# Patient Record
Sex: Male | Born: 1970 | Race: White | Hispanic: No | Marital: Single | Smoking: Current every day smoker
Health system: Southern US, Community
[De-identification: ages and names within clinical notes are randomized; demographics above are authoritative.]

## PROBLEM LIST (undated history)

## (undated) DIAGNOSIS — K3532 Acute appendicitis with perforation and localized peritonitis, without abscess: Secondary | ICD-10-CM

## (undated) DIAGNOSIS — K219 Gastro-esophageal reflux disease without esophagitis: Secondary | ICD-10-CM

## (undated) HISTORY — DX: Gastro-esophageal reflux disease without esophagitis: K21.9

## (undated) HISTORY — PX: APPENDECTOMY: SHX54

## (undated) HISTORY — PX: LUMBAR LAMINECTOMY: SHX95

---

## 1998-12-16 ENCOUNTER — Emergency Department (HOSPITAL_COMMUNITY): Admission: EM | Admit: 1998-12-16 | Discharge: 1998-12-16 | Payer: Self-pay | Admitting: Emergency Medicine

## 1998-12-16 ENCOUNTER — Encounter: Payer: Self-pay | Admitting: Emergency Medicine

## 1999-07-05 ENCOUNTER — Emergency Department (HOSPITAL_COMMUNITY): Admission: EM | Admit: 1999-07-05 | Discharge: 1999-07-05 | Payer: Self-pay | Admitting: *Deleted

## 2003-12-03 ENCOUNTER — Emergency Department (HOSPITAL_COMMUNITY): Admission: EM | Admit: 2003-12-03 | Discharge: 2003-12-03 | Payer: Self-pay | Admitting: Emergency Medicine

## 2004-07-04 ENCOUNTER — Emergency Department (HOSPITAL_COMMUNITY): Admission: EM | Admit: 2004-07-04 | Discharge: 2004-07-04 | Payer: Self-pay | Admitting: Emergency Medicine

## 2006-10-05 ENCOUNTER — Emergency Department (HOSPITAL_COMMUNITY): Admission: EM | Admit: 2006-10-05 | Discharge: 2006-10-05 | Payer: Self-pay | Admitting: Emergency Medicine

## 2007-03-06 ENCOUNTER — Emergency Department (HOSPITAL_COMMUNITY): Admission: EM | Admit: 2007-03-06 | Discharge: 2007-03-06 | Payer: Self-pay | Admitting: Emergency Medicine

## 2010-12-10 ENCOUNTER — Inpatient Hospital Stay (INDEPENDENT_AMBULATORY_CARE_PROVIDER_SITE_OTHER)
Admission: RE | Admit: 2010-12-10 | Discharge: 2010-12-10 | Disposition: A | Discharge status: Home / Self Care | Payer: Self-pay | Source: Ambulatory Visit | Attending: Family Medicine | Admitting: Family Medicine

## 2010-12-10 DIAGNOSIS — S93409A Sprain of unspecified ligament of unspecified ankle, initial encounter: Secondary | ICD-10-CM

## 2010-12-10 DIAGNOSIS — L089 Local infection of the skin and subcutaneous tissue, unspecified: Secondary | ICD-10-CM

## 2010-12-10 LAB — GLUCOSE, CAPILLARY: Glucose-Capillary: 96 mg/dL (ref 70–99)

## 2011-01-22 ENCOUNTER — Encounter: Payer: Self-pay | Admitting: Family Medicine

## 2011-01-22 ENCOUNTER — Ambulatory Visit (INDEPENDENT_AMBULATORY_CARE_PROVIDER_SITE_OTHER): Payer: Self-pay | Admitting: Family Medicine

## 2011-01-22 ENCOUNTER — Ambulatory Visit (HOSPITAL_BASED_OUTPATIENT_CLINIC_OR_DEPARTMENT_OTHER)
Admission: RE | Admit: 2011-01-22 | Discharge: 2011-01-22 | Disposition: A | Discharge status: Home / Self Care | Payer: Self-pay | Source: Ambulatory Visit | Attending: Family Medicine | Admitting: Family Medicine

## 2011-01-22 VITALS — BP 138/90 | HR 109 | Temp 97.5°F | Ht 70.0 in | Wt 178.0 lb

## 2011-01-22 DIAGNOSIS — X500XXA Overexertion from strenuous movement or load, initial encounter: Secondary | ICD-10-CM

## 2011-01-22 DIAGNOSIS — M25569 Pain in unspecified knee: Secondary | ICD-10-CM

## 2011-01-22 DIAGNOSIS — M25469 Effusion, unspecified knee: Secondary | ICD-10-CM | POA: Insufficient documentation

## 2011-01-22 DIAGNOSIS — M25562 Pain in left knee: Secondary | ICD-10-CM

## 2011-01-22 MED ORDER — MELOXICAM 15 MG PO TABS: 15.0000 mg | ORAL_TABLET | Freq: Every day | ORAL | Status: DC

## 2011-01-22 MED ORDER — TRAMADOL HCL 50 MG PO TABS: 50.0000 mg | ORAL_TABLET | Freq: Four times a day (QID) | ORAL | Status: AC | PRN

## 2011-01-22 NOTE — Patient Instructions (Signed)
When the [facility] coverage goes through, call me and we'll order an MRI of this knee. You most likely tore meniscus at a minimum and likely tore your ACL as well. Ice your knee 15 minutes at a time 3-4 times a day. Meloxicam 15mg  daily with food for pain and inflammation. Tramadol as needed for severe pain. ACE wrap the area from bottom up to help with swelling. Elevate above the level of your heart when possible also to help with swelling. If you change your mind regarding knee aspiration and injection, call me and we can do this - it should be fairly easy and definitely not as painful as the one you had before.
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
## 2011-01-22 NOTE — Assessment & Plan Note (Signed)
obvious effusion confirmed by ultrasound.  No history of gout and is fairly confident this knee pain/injury started with injury 5-6 weeks ago when inverted left ankle.  Unable to relax enough for adequate ACL/PCL testing.  Medial joint line pain suggests medial meniscal tear.  Degree of effusion would suggest probable ACL tear as well.  Patient declined aspiration of knee today - he had one done 8-10 years ago that was very painful and without local anesthesia.  Advised if he changes his mind to call for an appointment.  Icing, mobic, elevation, ACE wrap, and to call Willie [name] to obtain [facility] coverage if he qualifies then call us.  Would proceed then with MRI to further assess.

## 2012-01-19 ENCOUNTER — Emergency Department (HOSPITAL_COMMUNITY): Payer: Self-pay

## 2012-01-19 ENCOUNTER — Emergency Department (HOSPITAL_COMMUNITY)
Admission: EM | Admit: 2012-01-19 | Discharge: 2012-01-19 | Disposition: A | Discharge status: Home / Self Care | Payer: Self-pay | Attending: Emergency Medicine | Admitting: Emergency Medicine

## 2012-01-19 ENCOUNTER — Encounter (HOSPITAL_COMMUNITY): Payer: Self-pay | Admitting: *Deleted

## 2012-01-19 DIAGNOSIS — S0003XA Contusion of scalp, initial encounter: Secondary | ICD-10-CM | POA: Insufficient documentation

## 2012-01-19 DIAGNOSIS — M79609 Pain in unspecified limb: Secondary | ICD-10-CM | POA: Insufficient documentation

## 2012-01-19 DIAGNOSIS — S0990XA Unspecified injury of head, initial encounter: Secondary | ICD-10-CM

## 2012-01-19 DIAGNOSIS — IMO0002 Reserved for concepts with insufficient information to code with codable children: Secondary | ICD-10-CM | POA: Insufficient documentation

## 2012-01-19 DIAGNOSIS — M542 Cervicalgia: Secondary | ICD-10-CM | POA: Insufficient documentation

## 2012-01-19 DIAGNOSIS — T07XXXA Unspecified multiple injuries, initial encounter: Secondary | ICD-10-CM

## 2012-01-19 DIAGNOSIS — R51 Headache: Secondary | ICD-10-CM | POA: Insufficient documentation

## 2012-01-19 DIAGNOSIS — S0093XA Contusion of unspecified part of head, initial encounter: Secondary | ICD-10-CM

## 2012-01-19 MED ORDER — BACITRACIN ZINC 500 UNIT/GM EX OINT: TOPICAL_OINTMENT | Freq: Two times a day (BID) | CUTANEOUS | Status: DC

## 2012-01-19 MED ORDER — BACITRACIN ZINC 500 UNIT/GM EX OINT
1.0000 "application " | TOPICAL_OINTMENT | Freq: Two times a day (BID) | CUTANEOUS | Status: DC
  Filled 2012-01-19: qty 15

## 2012-01-19 MED ORDER — NAPROXEN 500 MG PO TABS: 500.0000 mg | ORAL_TABLET | Freq: Two times a day (BID) | ORAL | Status: DC

## 2012-01-19 NOTE — ED Notes (Signed)
Pt given ice pack
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
## 2013-02-13 ENCOUNTER — Inpatient Hospital Stay (HOSPITAL_COMMUNITY)
Admission: EM | Admit: 2013-02-13 | Discharge: 2013-02-18 | DRG: 339 | Disposition: A | Discharge status: Home / Self Care | Payer: Medicaid Other | Attending: Surgery | Admitting: Surgery

## 2013-02-13 ENCOUNTER — Emergency Department (HOSPITAL_COMMUNITY): Payer: Medicaid Other

## 2013-02-13 ENCOUNTER — Encounter (HOSPITAL_COMMUNITY): Admission: EM | Disposition: A | Discharge status: Home / Self Care | Payer: Self-pay | Source: Home / Self Care

## 2013-02-13 ENCOUNTER — Encounter (HOSPITAL_COMMUNITY): Payer: Medicaid Other | Admitting: Certified Registered"

## 2013-02-13 ENCOUNTER — Encounter (HOSPITAL_COMMUNITY): Payer: Self-pay | Admitting: Emergency Medicine

## 2013-02-13 ENCOUNTER — Emergency Department (HOSPITAL_COMMUNITY): Payer: Medicaid Other | Admitting: Certified Registered"

## 2013-02-13 DIAGNOSIS — F17200 Nicotine dependence, unspecified, uncomplicated: Secondary | ICD-10-CM | POA: Diagnosis present

## 2013-02-13 DIAGNOSIS — K929 Disease of digestive system, unspecified: Secondary | ICD-10-CM | POA: Diagnosis not present

## 2013-02-13 DIAGNOSIS — Z8249 Family history of ischemic heart disease and other diseases of the circulatory system: Secondary | ICD-10-CM

## 2013-02-13 DIAGNOSIS — Z880 Allergy status to penicillin: Secondary | ICD-10-CM

## 2013-02-13 DIAGNOSIS — Z23 Encounter for immunization: Secondary | ICD-10-CM

## 2013-02-13 DIAGNOSIS — Z833 Family history of diabetes mellitus: Secondary | ICD-10-CM

## 2013-02-13 DIAGNOSIS — T8140XA Infection following a procedure, unspecified, initial encounter: Secondary | ICD-10-CM | POA: Diagnosis not present

## 2013-02-13 DIAGNOSIS — L02219 Cutaneous abscess of trunk, unspecified: Secondary | ICD-10-CM | POA: Diagnosis not present

## 2013-02-13 DIAGNOSIS — K3580 Unspecified acute appendicitis: Secondary | ICD-10-CM

## 2013-02-13 DIAGNOSIS — K352 Acute appendicitis with generalized peritonitis, without abscess: Principal | ICD-10-CM | POA: Diagnosis present

## 2013-02-13 DIAGNOSIS — Y836 Removal of other organ (partial) (total) as the cause of abnormal reaction of the patient, or of later complication, without mention of misadventure at the time of the procedure: Secondary | ICD-10-CM | POA: Diagnosis not present

## 2013-02-13 DIAGNOSIS — Y9210 Unspecified residential institution as the place of occurrence of the external cause: Secondary | ICD-10-CM | POA: Diagnosis not present

## 2013-02-13 DIAGNOSIS — Z79899 Other long term (current) drug therapy: Secondary | ICD-10-CM

## 2013-02-13 DIAGNOSIS — G8918 Other acute postprocedural pain: Secondary | ICD-10-CM | POA: Diagnosis not present

## 2013-02-13 DIAGNOSIS — K3532 Acute appendicitis with perforation and localized peritonitis, without abscess: Secondary | ICD-10-CM | POA: Diagnosis present

## 2013-02-13 DIAGNOSIS — K35209 Acute appendicitis with generalized peritonitis, without abscess, unspecified as to perforation: Principal | ICD-10-CM | POA: Diagnosis present

## 2013-02-13 DIAGNOSIS — K560 Paralytic ileus: Secondary | ICD-10-CM | POA: Diagnosis not present

## 2013-02-13 HISTORY — PX: LAPAROSCOPIC APPENDECTOMY: SHX408

## 2013-02-13 HISTORY — DX: Acute appendicitis with perforation, localized peritonitis, and gangrene, without abscess: K35.32

## 2013-02-13 HISTORY — DX: Acute appendicitis with perforation and localized peritonitis, without abscess: K35.32

## 2013-02-13 LAB — CBC WITH DIFFERENTIAL/PLATELET
Basophils Absolute: 0 10*3/uL (ref 0.0–0.1)
Basophils Relative: 0 % (ref 0–1)
Eosinophils Relative: 0 % (ref 0–5)
HCT: 46.8 % (ref 39.0–52.0)
Hemoglobin: 17 g/dL (ref 13.0–17.0)
MCHC: 36.3 g/dL — ABNORMAL HIGH (ref 30.0–36.0)
MCV: 92.1 fL (ref 78.0–100.0)
Monocytes Absolute: 1.5 10*3/uL — ABNORMAL HIGH (ref 0.1–1.0)
Monocytes Relative: 8 % (ref 3–12)
Neutro Abs: 15.7 10*3/uL — ABNORMAL HIGH (ref 1.7–7.7)
RDW: 12.5 % (ref 11.5–15.5)

## 2013-02-13 LAB — COMPREHENSIVE METABOLIC PANEL
AST: 18 U/L (ref 0–37)
BUN: 13 mg/dL (ref 6–23)
CO2: 23 mEq/L (ref 19–32)
Calcium: 9.1 mg/dL (ref 8.4–10.5)
Chloride: 104 mEq/L (ref 96–112)
Creatinine, Ser: 1 mg/dL (ref 0.50–1.35)
GFR calc non Af Amer: 90 mL/min — ABNORMAL LOW (ref >90)
Total Bilirubin: 0.8 mg/dL (ref 0.3–1.2)

## 2013-02-13 LAB — LIPASE, BLOOD: Lipase: 16 U/L (ref 11–59)

## 2013-02-13 LAB — URINALYSIS, ROUTINE W REFLEX MICROSCOPIC
Leukocytes, UA: NEGATIVE
Nitrite: NEGATIVE
Protein, ur: NEGATIVE mg/dL
Specific Gravity, Urine: <1.005 — ABNORMAL LOW (ref 1.005–1.030)
Urobilinogen, UA: 0.2 mg/dL (ref 0.0–1.0)

## 2013-02-13 SURGERY — APPENDECTOMY, LAPAROSCOPIC
Anesthesia: General | Site: Abdomen | Wound class: Dirty or Infected

## 2013-02-13 MED ORDER — HYDROMORPHONE HCL PF 1 MG/ML IJ SOLN
1.0000 mg | Freq: Once | INTRAMUSCULAR | Status: AC
  Administered 2013-02-13: 1 mg via INTRAVENOUS
  Filled 2013-02-13: qty 1

## 2013-02-13 MED ORDER — ONDANSETRON HCL 4 MG/2ML IJ SOLN
4.0000 mg | Freq: Once | INTRAMUSCULAR | Status: AC
  Administered 2013-02-13: 4 mg via INTRAVENOUS
  Filled 2013-02-13: qty 2

## 2013-02-13 MED ORDER — ONDANSETRON HCL 4 MG/2ML IJ SOLN: 4.0000 mg | Freq: Once | INTRAMUSCULAR | Status: AC

## 2013-02-13 MED ORDER — IOHEXOL 300 MG/ML  SOLN
25.0000 mL | Freq: Once | INTRAMUSCULAR | Status: AC | PRN
  Administered 2013-02-13: 25 mL via ORAL

## 2013-02-13 MED ORDER — SODIUM CHLORIDE 0.9 % IV SOLN
INTRAVENOUS | Status: DC
  Administered 2013-02-13: 20:00:00 via INTRAVENOUS

## 2013-02-13 MED ORDER — SODIUM CHLORIDE 0.9 % IV BOLUS (SEPSIS)
250.0000 mL | Freq: Once | INTRAVENOUS | Status: AC
  Administered 2013-02-13: 250 mL via INTRAVENOUS

## 2013-02-13 MED ORDER — IOHEXOL 300 MG/ML  SOLN
100.0000 mL | Freq: Once | INTRAMUSCULAR | Status: AC | PRN
  Administered 2013-02-13: 100 mL via INTRAVENOUS

## 2013-02-13 MED ORDER — CIPROFLOXACIN IN D5W 400 MG/200ML IV SOLN
400.0000 mg | INTRAVENOUS | Status: AC
  Administered 2013-02-14: 400 mg via INTRAVENOUS
  Filled 2013-02-13: qty 200

## 2013-02-13 MED ORDER — BUPIVACAINE-EPINEPHRINE PF 0.25-1:200000 % IJ SOLN
INTRAMUSCULAR | Status: AC
  Filled 2013-02-13: qty 30

## 2013-02-13 MED ORDER — LACTATED RINGERS IV SOLN
INTRAVENOUS | Status: DC | PRN
  Administered 2013-02-13 – 2013-02-14 (×2): via INTRAVENOUS

## 2013-02-13 SURGICAL SUPPLY — 46 items
APPLIER CLIP ROT 10 11.4 M/L (STAPLE)
BLADE SURG ROTATE 9660 (MISCELLANEOUS) ×2 IMPLANT
CANISTER SUCTION 2500CC (MISCELLANEOUS) ×2 IMPLANT
CHLORAPREP W/TINT 26ML (MISCELLANEOUS) ×2 IMPLANT
CLIP APPLIE ROT 10 11.4 M/L (STAPLE) IMPLANT
COVER SURGICAL LIGHT HANDLE (MISCELLANEOUS) ×2 IMPLANT
CUTTER LINEAR ENDO 35 ETS (STAPLE) ×2 IMPLANT
CUTTER LINEAR ENDO 35 ETS TH (STAPLE) IMPLANT
DECANTER SPIKE VIAL GLASS SM (MISCELLANEOUS) ×2 IMPLANT
DERMABOND ADHESIVE PROPEN (GAUZE/BANDAGES/DRESSINGS) ×1
DERMABOND ADVANCED (GAUZE/BANDAGES/DRESSINGS) ×1
DERMABOND ADVANCED .7 DNX12 (GAUZE/BANDAGES/DRESSINGS) ×1 IMPLANT
DERMABOND ADVANCED .7 DNX6 (GAUZE/BANDAGES/DRESSINGS) ×1 IMPLANT
DRAPE UTILITY 15X26 W/TAPE STR (DRAPE) ×4 IMPLANT
DRSG TEGADERM 4X4.75 (GAUZE/BANDAGES/DRESSINGS) ×2 IMPLANT
ELECT REM PT RETURN 9FT ADLT (ELECTROSURGICAL) ×2
ELECTRODE REM PT RTRN 9FT ADLT (ELECTROSURGICAL) ×1 IMPLANT
ENDOLOOP SUT PDS II  0 18 (SUTURE)
ENDOLOOP SUT PDS II 0 18 (SUTURE) IMPLANT
GLOVE BIOGEL PI IND STRL 7.5 (GLOVE) ×1 IMPLANT
GLOVE BIOGEL PI IND STRL 8 (GLOVE) ×1 IMPLANT
GLOVE BIOGEL PI INDICATOR 7.5 (GLOVE) ×1
GLOVE BIOGEL PI INDICATOR 8 (GLOVE) ×1
GLOVE ECLIPSE 7.5 STRL STRAW (GLOVE) ×2 IMPLANT
GLOVE SURG SS PI 7.5 STRL IVOR (GLOVE) ×4 IMPLANT
GOWN STRL NON-REIN LRG LVL3 (GOWN DISPOSABLE) ×4 IMPLANT
IV NS IRRIG 3000ML ARTHROMATIC (IV SOLUTION) ×2 IMPLANT
KIT BASIN OR (CUSTOM PROCEDURE TRAY) ×2 IMPLANT
KIT ROOM TURNOVER OR (KITS) ×2 IMPLANT
NS IRRIG 1000ML POUR BTL (IV SOLUTION) ×2 IMPLANT
PAD ARMBOARD 7.5X6 YLW CONV (MISCELLANEOUS) ×4 IMPLANT
PENCIL BUTTON HOLSTER BLD 10FT (ELECTRODE) IMPLANT
POUCH SPECIMEN RETRIEVAL 10MM (ENDOMECHANICALS) ×2 IMPLANT
RELOAD /EVU35 (ENDOMECHANICALS) IMPLANT
RELOAD CUTTER ETS 35MM STAND (ENDOMECHANICALS) ×2 IMPLANT
SET IRRIG TUBING LAPAROSCOPIC (IRRIGATION / IRRIGATOR) ×2 IMPLANT
SPECIMEN JAR SMALL (MISCELLANEOUS) ×2 IMPLANT
SUT MNCRL AB 4-0 PS2 18 (SUTURE) ×2 IMPLANT
TOWEL OR 17X24 6PK STRL BLUE (TOWEL DISPOSABLE) ×2 IMPLANT
TOWEL OR 17X26 10 PK STRL BLUE (TOWEL DISPOSABLE) ×2 IMPLANT
TRAY FOLEY CATH 16FR SILVER (SET/KITS/TRAYS/PACK) ×2 IMPLANT
TRAY LAPAROSCOPIC (CUSTOM PROCEDURE TRAY) ×2 IMPLANT
TROCAR XCEL 12X100 BLDLESS (ENDOMECHANICALS) ×2 IMPLANT
TROCAR XCEL BLUNT TIP 100MML (ENDOMECHANICALS) ×2 IMPLANT
TROCAR XCEL NON-BLD 5MMX100MML (ENDOMECHANICALS) ×2 IMPLANT
WATER STERILE IRR 1000ML POUR (IV SOLUTION) IMPLANT

## 2013-02-13 NOTE — ED Notes (Signed)
MD at bedside. 

## 2013-02-13 NOTE — ED Notes (Signed)
Belongings given to pt's wife Karon Heckendorn.

## 2013-02-13 NOTE — ED Notes (Signed)
Pt c/o lower abd pain starting last night, is tender to lower abd with palpation. Denies n/v states he has gotten the chills

## 2013-02-13 NOTE — H&P (Signed)
Willie [name] is an 42 y.o. male.   Chief Complaint: Abdominal pain HPI: 24 hours history of abdominal pain in the RLQ associated with chills and questionable fevers.  No vomiting, no nausea, but poor appetite.  CT done in the ED demonstrates acute appendicitis.  History reviewed. No pertinent past medical history.  History reviewed. No pertinent past surgical history.  Family History  Problem Relation Age of Onset  . Hypertension Mother   . Hyperlipidemia Mother   . Hypertension Father   . Diabetes Father   . Sudden death Neg Hx   . Heart attack Neg Hx    Social History:  reports that he has been smoking Cigarettes.  He has been smoking about 1.00 pack per day. He does not have any smokeless tobacco history on file. He reports that he drinks alcohol. He reports that he does not use illicit drugs.  Allergies:  Allergies  Allergen Reactions  . Penicillins     Unknown reaction     (Not in a hospital admission)  Results for orders placed during the hospital encounter of 02/13/13 (from the past 48 hour(s))  CBC WITH DIFFERENTIAL     Status: Abnormal   Collection Time    02/13/13  6:40 PM      Result Value Range   WBC 19.1 (*) 4.0 - 10.5 K/uL   RBC 5.08  4.22 - 5.81 MIL/uL   Hemoglobin 17.0  13.0 - 17.0 g/dL   HCT 47.8  29.5 - 62.1 %   MCV 92.1  78.0 - 100.0 fL   MCH 33.5  26.0 - 34.0 pg   MCHC 36.3 (*) 30.0 - 36.0 g/dL   RDW 30.8  65.7 - 84.6 %   Platelets 231  150 - 400 K/uL   Neutrophils Relative % 82 (*) 43 - 77 %   Neutro Abs 15.7 (*) 1.7 - 7.7 K/uL   Lymphocytes Relative 9 (*) 12 - 46 %   Lymphs Abs 1.8  0.7 - 4.0 K/uL   Monocytes Relative 8  3 - 12 %   Monocytes Absolute 1.5 (*) 0.1 - 1.0 K/uL   Eosinophils Relative 0  0 - 5 %   Eosinophils Absolute 0.1  0.0 - 0.7 K/uL   Basophils Relative 0  0 - 1 %   Basophils Absolute 0.0  0.0 - 0.1 K/uL  COMPREHENSIVE METABOLIC PANEL     Status: Abnormal   Collection Time    02/13/13  6:40 PM      Result Value Range   Sodium 139  135 - 145 mEq/L   Potassium 3.9  3.5 - 5.1 mEq/L   Chloride 104  96 - 112 mEq/L   CO2 23  19 - 32 mEq/L   Glucose, Bld 111 (*) 70 - 99 mg/dL   BUN 13  6 - 23 mg/dL   Creatinine, Ser 9.62  0.50 - 1.35 mg/dL   Calcium 9.1  8.4 - 95.2 mg/dL   Total Protein 7.1  6.0 - 8.3 g/dL   Albumin 3.8  3.5 - 5.2 g/dL   AST 18  0 - 37 U/L   ALT 13  0 - 53 U/L   Alkaline Phosphatase 81  39 - 117 U/L   Total Bilirubin 0.8  0.3 - 1.2 mg/dL   GFR calc non Af Amer 90 (*) >90 mL/min   GFR calc Af Amer >90  >90 mL/min   Comment: (NOTE)     The eGFR has been calculated using  the CKD EPI equation.     This calculation has not been validated in all clinical situations.     eGFR's persistently <90 mL/min signify possible Chronic Kidney     Disease.  LIPASE, BLOOD     Status: None   Collection Time    02/13/13  6:40 PM      Result Value Range   Lipase 16  11 - 59 U/L  URINALYSIS, ROUTINE W REFLEX MICROSCOPIC     Status: Abnormal   Collection Time    02/13/13 10:30 PM      Result Value Range   Color, Urine YELLOW  YELLOW   APPearance CLEAR  CLEAR   Specific Gravity, Urine <1.005 (*) 1.005 - 1.030   pH 5.5  5.0 - 8.0   Glucose, UA NEGATIVE  NEGATIVE mg/dL   Hgb urine dipstick NEGATIVE  NEGATIVE   Bilirubin Urine NEGATIVE  NEGATIVE   Ketones, ur 15 (*) NEGATIVE mg/dL   Protein, ur NEGATIVE  NEGATIVE mg/dL   Urobilinogen, UA 0.2  0.0 - 1.0 mg/dL   Nitrite NEGATIVE  NEGATIVE   Leukocytes, UA NEGATIVE  NEGATIVE   Comment: MICROSCOPIC NOT DONE ON URINES WITH NEGATIVE PROTEIN, BLOOD, LEUKOCYTES, NITRITE, OR GLUCOSE <1000 mg/dL.   Ct Abdomen Pelvis W Contrast  02/13/2013   CLINICAL DATA:  Lower abdominal pain  EXAM: CT ABDOMEN AND PELVIS WITH CONTRAST  TECHNIQUE: Multidetector CT imaging of the abdomen and pelvis was performed using the standard protocol following bolus administration of intravenous contrast.  CONTRAST:  OMNIPAQUE IOHEXOL 300 MG/ML  SOLN  COMPARISON:  None.  FINDINGS: Lung  bases are free of acute infiltrate or sizable effusion.  The liver, spleen, adrenal glands and pancreas are normal in their CT appearance. The gallbladder is well distended and demonstrates multiple gallstones. No complicating factors are seen. The kidneys are well visualized and reveal a normal enhancement pattern. Normal excretion of contrast is noted on delayed imaging. In the right lower quadrant the appendix is mildly enlarged and periappendiceal inflammatory changes are seen consistent with acute appendicitis. A few small foci of air are noted adjacent to the appendix which may represent micro-perforations although this is difficult to evaluate on this exam. Diffuse diverticular change is noted without evidence of diverticulitis. The bladder is well distended.  IMPRESSION: Changes consistent with appendicitis. A few small foci of air are noted. The possibility of micro-perforation cannot be totally excluded.   Electronically Signed   By: Alcide Clever M.D.   On: 02/13/2013 21:15    Review of Systems  Constitutional: Positive for chills. Negative for fever.  HENT: Negative.   Gastrointestinal: Positive for abdominal pain. Negative for nausea and vomiting.  Musculoskeletal: Negative.   Skin: Negative.   Neurological: Negative.   Endo/Heme/Allergies: Negative.   Psychiatric/Behavioral: Negative.     Blood pressure 103/54, pulse 103, temperature 98.3 F (36.8 C), temperature source Oral, SpO2 93.00%. Physical Exam  Constitutional: He is oriented to person, place, and time. He appears well-developed and well-nourished.  HENT:  Head: Normocephalic and atraumatic.  Eyes: Conjunctivae and EOM are normal. Pupils are equal, round, and reactive to light.  Neck: Normal range of motion. Neck supple.  Cardiovascular: Normal rate, regular rhythm and normal heart sounds.   Respiratory: Effort normal and breath sounds normal. He has no wheezes.  GI: Soft. Bowel sounds are normal. There is tenderness.  There is rebound, guarding and tenderness at McBurney's point.  Musculoskeletal: Normal range of motion.  Neurological: He is alert  and oriented to person, place, and time. He has normal reflexes.  Skin: Skin is warm and dry.  Psychiatric: He has a normal mood and affect. His behavior is normal. Judgment and thought content normal.     Assessment/Plan Acute appendicitis with 42 otherwise healthy smoker CT reviewed and the appendix is in the RLQ extending posterior and laterally. Patient requests a nicotine patch postoperatively.  For Lap Appy. Ciprofloxacin on call to OR.  Cherylynn Ridges 02/13/2013, 11:17 PM

## 2013-02-13 NOTE — ED Notes (Signed)
Pt reports increase in pain. MD made aware.

## 2013-02-13 NOTE — ED Provider Notes (Signed)
CSN: [id number]     Arrival date & time 02/13/13  1823 History   First MD Initiated Contact with Patient 02/13/13 1835     Chief Complaint  Patient presents with  . Abdominal Pain   (Consider location/radiation/quality/duration/timing/severity/associated sxs/prior Treatment) Patient is a 42 y.o. male presenting with abdominal pain. The history is provided by the patient.  Abdominal Pain Associated symptoms: chills   Associated symptoms: no chest pain, no dysuria, no nausea, no shortness of breath and no vomiting    patient well until around the 11:00 PM yesterday when he started with lower quadrant abdominal pain somewhat worse on the right than the left. Not associated with nausea and vomiting this had chills throughout the day and has felt very wiped out. Patient states that the pain is 8/10 constant in nature not made better or worse by anything. Pain is an ache and sharp. Pain is nonradiating.  History reviewed. No pertinent past medical history. History reviewed. No pertinent past surgical history. Family History  Problem Relation Age of Onset  . Hypertension Mother   . Hyperlipidemia Mother   . Hypertension Father   . Diabetes Father   . Sudden death Neg Hx   . Heart attack Neg Hx    History  Substance Use Topics  . Smoking status: Current Every Day Smoker -- 1.00 packs/day    Types: Cigarettes  . Smokeless tobacco: Not on file  . Alcohol Use: Yes    Review of Systems  Constitutional: Positive for chills.  HENT: Negative for congestion.   Eyes: Negative for redness.  Respiratory: Negative for shortness of breath.   Cardiovascular: Negative for chest pain.  Gastrointestinal: Positive for abdominal pain. Negative for nausea and vomiting.  Genitourinary: Negative for dysuria.  Musculoskeletal: Negative for back pain.  Skin: Negative for rash.  Neurological: Negative for headaches.  Hematological: Does not bruise/bleed easily.  Psychiatric/Behavioral: Negative for  confusion.    Allergies  Penicillins  Home Medications   Current Outpatient Rx  Name  Route  Sig  Dispense  Refill  . esomeprazole (NEXIUM) 40 MG capsule   Oral   Take 40 mg by mouth daily as needed (for heartburn).          BP 121/65  Pulse 86  Temp(Src) 98.3 F (36.8 C) (Oral)  SpO2 99% Physical Exam  Nursing note and vitals reviewed. Constitutional: He is oriented to person, place, and time. He appears well-developed and well-nourished. No distress.  HENT:  Head: Normocephalic and atraumatic.  Mouth/Throat: Oropharynx is clear and moist.  Eyes: Conjunctivae and EOM are normal. Pupils are equal, round, and reactive to light.  Neck: Normal range of motion.  Cardiovascular: Normal rate and regular rhythm.   No murmur heard. Pulmonary/Chest: Effort normal and breath sounds normal.  Abdominal: Soft. Bowel sounds are normal. He exhibits no distension. There is tenderness.  Musculoskeletal: Normal range of motion. He exhibits no tenderness.  Neurological: He is alert and oriented to person, place, and time. No cranial nerve deficit. He exhibits normal muscle tone. Coordination normal.  Skin: Skin is warm. No rash noted.    ED Course  Procedures (including critical care time) Labs Review Labs Reviewed  CBC WITH DIFFERENTIAL - Abnormal; Notable for the following:    WBC 19.1 (*)    MCHC 36.3 (*)    Neutrophils Relative % 82 (*)    Neutro Abs 15.7 (*)    Lymphocytes Relative 9 (*)    Monocytes Absolute 1.5 (*)  All other components within normal limits  COMPREHENSIVE METABOLIC PANEL - Abnormal; Notable for the following:    Glucose, Bld 111 (*)    GFR calc non Af Amer 90 (*)    All other components within normal limits  LIPASE, BLOOD  URINALYSIS, ROUTINE W REFLEX MICROSCOPIC   Results for orders placed during the hospital encounter of 02/13/13  CBC WITH DIFFERENTIAL      Result Value Range   WBC 19.1 (*) 4.0 - 10.5 K/uL   RBC 5.08  4.22 - 5.81 MIL/uL    Hemoglobin 17.0  13.0 - 17.0 g/dL   HCT 16.1  09.6 - 04.5 %   MCV 92.1  78.0 - 100.0 fL   MCH 33.5  26.0 - 34.0 pg   MCHC 36.3 (*) 30.0 - 36.0 g/dL   RDW 40.9  81.1 - 91.4 %   Platelets 231  150 - 400 K/uL   Neutrophils Relative % 82 (*) 43 - 77 %   Neutro Abs 15.7 (*) 1.7 - 7.7 K/uL   Lymphocytes Relative 9 (*) 12 - 46 %   Lymphs Abs 1.8  0.7 - 4.0 K/uL   Monocytes Relative 8  3 - 12 %   Monocytes Absolute 1.5 (*) 0.1 - 1.0 K/uL   Eosinophils Relative 0  0 - 5 %   Eosinophils Absolute 0.1  0.0 - 0.7 K/uL   Basophils Relative 0  0 - 1 %   Basophils Absolute 0.0  0.0 - 0.1 K/uL  COMPREHENSIVE METABOLIC PANEL      Result Value Range   Sodium 139  135 - 145 mEq/L   Potassium 3.9  3.5 - 5.1 mEq/L   Chloride 104  96 - 112 mEq/L   CO2 23  19 - 32 mEq/L   Glucose, Bld 111 (*) 70 - 99 mg/dL   BUN 13  6 - 23 mg/dL   Creatinine, Ser 7.82  0.50 - 1.35 mg/dL   Calcium 9.1  8.4 - 95.6 mg/dL   Total Protein 7.1  6.0 - 8.3 g/dL   Albumin 3.8  3.5 - 5.2 g/dL   AST 18  0 - 37 U/L   ALT 13  0 - 53 U/L   Alkaline Phosphatase 81  39 - 117 U/L   Total Bilirubin 0.8  0.3 - 1.2 mg/dL   GFR calc non Af Amer 90 (*) >90 mL/min   GFR calc Af Amer >90  >90 mL/min  LIPASE, BLOOD      Result Value Range   Lipase 16  11 - 59 U/L    Imaging Review Ct Abdomen Pelvis W Contrast  02/13/2013   CLINICAL DATA:  Lower abdominal pain  EXAM: CT ABDOMEN AND PELVIS WITH CONTRAST  TECHNIQUE: Multidetector CT imaging of the abdomen and pelvis was performed using the standard protocol following bolus administration of intravenous contrast.  CONTRAST:  OMNIPAQUE IOHEXOL 300 MG/ML  SOLN  COMPARISON:  None.  FINDINGS: Lung bases are free of acute infiltrate or sizable effusion.  The liver, spleen, adrenal glands and pancreas are normal in their CT appearance. The gallbladder is well distended and demonstrates multiple gallstones. No complicating factors are seen. The kidneys are well visualized and reveal a normal  enhancement pattern. Normal excretion of contrast is noted on delayed imaging. In the right lower quadrant the appendix is mildly enlarged and periappendiceal inflammatory changes are seen consistent with acute appendicitis. A few small foci of air are noted adjacent to the appendix which  may represent micro-perforations although this is difficult to evaluate on this exam. Diffuse diverticular change is noted without evidence of diverticulitis. The bladder is well distended.  IMPRESSION: Changes consistent with appendicitis. A few small foci of air are noted. The possibility of micro-perforation cannot be totally excluded.   Electronically Signed   By: Alcide Clever M.D.   On: 02/13/2013 21:15    EKG Interpretation   None       MDM   1. Acute appendicitis    CT scan confirms appendicitis. Patient's onset of symptoms would've been 11:00 PM on Thursday. We'll discuss with Gen. surgery.    Shelda Jakes, MD 02/13/13 2211

## 2013-02-13 NOTE — ED Notes (Signed)
Pt reports lower abdominal pain since last night, progressively worsening. Pt denies nausea, BM today and normal. Pain increased with palpation. Pt appears to be in discomfort. Placed on monitor.

## 2013-02-13 NOTE — Anesthesia Preprocedure Evaluation (Signed)
Anesthesia Evaluation  Patient identified by MRN, date of birth, ID band Patient awake    Reviewed: Allergy & Precautions, H&P , NPO status , Patient's Chart, lab work & pertinent test results  Airway Mallampati: II TM Distance: >3 FB Neck ROM: Full    Dental no notable dental hx. (+) Teeth Intact and Dental Advisory Given   Pulmonary Current Smoker,  breath sounds clear to auscultation  Pulmonary exam normal       Cardiovascular negative cardio ROS  Rhythm:Regular Rate:Normal     Neuro/Psych negative neurological ROS  negative psych ROS   GI/Hepatic negative GI ROS, Neg liver ROS,   Endo/Other  negative endocrine ROS  Renal/GU negative Renal ROS  negative genitourinary   Musculoskeletal   Abdominal   Peds  Hematology negative hematology ROS (+)   Anesthesia Other Findings   Reproductive/Obstetrics negative OB ROS                         Anesthesia Physical Anesthesia Plan  ASA: II and emergent  Anesthesia Plan: General   Post-op Pain Management:    Induction: Intravenous, Rapid sequence and Cricoid pressure planned  Airway Management Planned: Oral ETT  Additional Equipment:   Intra-op Plan:   Post-operative Plan: Extubation in OR  Informed Consent: I have reviewed the patients History and Physical, chart, labs and discussed the procedure including the risks, benefits and alternatives for the proposed anesthesia with the patient or authorized representative who has indicated his/her understanding and acceptance.   Dental advisory given  Plan Discussed with: CRNA  Anesthesia Plan Comments:         Anesthesia Quick Evaluation  

## 2013-02-13 NOTE — ED Notes (Signed)
[phone] Willie [name]

## 2013-02-13 NOTE — ED Notes (Signed)
Pt transported to OR

## 2013-02-14 ENCOUNTER — Encounter (HOSPITAL_COMMUNITY): Payer: Self-pay | Admitting: Surgery

## 2013-02-14 LAB — CBC
HCT: 44.2 % (ref 39.0–52.0)
MCH: 33.5 pg (ref 26.0–34.0)
MCHC: 35.1 g/dL (ref 30.0–36.0)
MCV: 95.7 fL (ref 78.0–100.0)
Platelets: 218 10*3/uL (ref 150–400)
RDW: 12.7 % (ref 11.5–15.5)
WBC: 20.7 10*3/uL — ABNORMAL HIGH (ref 4.0–10.5)

## 2013-02-14 LAB — BASIC METABOLIC PANEL
BUN: 11 mg/dL (ref 6–23)
CO2: 25 mEq/L (ref 19–32)
Calcium: 8.9 mg/dL (ref 8.4–10.5)
Chloride: 108 mEq/L (ref 96–112)
Creatinine, Ser: 1.01 mg/dL (ref 0.50–1.35)
GFR calc Af Amer: >90 mL/min (ref >90)
GFR calc non Af Amer: 89 mL/min — ABNORMAL LOW (ref >90)
Glucose, Bld: 154 mg/dL — ABNORMAL HIGH (ref 70–99)

## 2013-02-14 MED ORDER — NICOTINE 21 MG/24HR TD PT24
21.0000 mg | MEDICATED_PATCH | Freq: Every day | TRANSDERMAL | Status: DC
  Administered 2013-02-14 – 2013-02-18 (×6): 21 mg via TRANSDERMAL
  Filled 2013-02-14 (×7): qty 1

## 2013-02-14 MED ORDER — CIPROFLOXACIN IN D5W 400 MG/200ML IV SOLN
400.0000 mg | Freq: Two times a day (BID) | INTRAVENOUS | Status: DC
  Administered 2013-02-14 – 2013-02-18 (×8): 400 mg via INTRAVENOUS
  Filled 2013-02-14 (×10): qty 200

## 2013-02-14 MED ORDER — SODIUM CHLORIDE 0.9 % IR SOLN
Status: DC | PRN
  Administered 2013-02-14: 1000 mL

## 2013-02-14 MED ORDER — METRONIDAZOLE IN NACL 5-0.79 MG/ML-% IV SOLN
500.0000 mg | Freq: Once | INTRAVENOUS | Status: AC
  Administered 2013-02-14: 500 mg via INTRAVENOUS
  Filled 2013-02-14: qty 100

## 2013-02-14 MED ORDER — INFLUENZA VAC SPLIT QUAD 0.5 ML IM SUSP
0.5000 mL | INTRAMUSCULAR | Status: AC
  Administered 2013-02-15: 0.5 mL via INTRAMUSCULAR
  Filled 2013-02-14: qty 0.5

## 2013-02-14 MED ORDER — METRONIDAZOLE IN NACL 5-0.79 MG/ML-% IV SOLN
500.0000 mg | Freq: Three times a day (TID) | INTRAVENOUS | Status: DC
  Administered 2013-02-14 – 2013-02-18 (×13): 500 mg via INTRAVENOUS
  Filled 2013-02-14 (×15): qty 100

## 2013-02-14 MED ORDER — BUPIVACAINE-EPINEPHRINE 0.25% -1:200000 IJ SOLN
INTRAMUSCULAR | Status: DC | PRN
  Administered 2013-02-14: 19 mL

## 2013-02-14 MED ORDER — HYDROMORPHONE HCL PF 1 MG/ML IJ SOLN
1.0000 mg | INTRAMUSCULAR | Status: DC | PRN
Start: 2013-02-14 — End: 2013-02-18
  Administered 2013-02-14 (×4): 1 mg via INTRAVENOUS
  Administered 2013-02-15: 2 mg via INTRAVENOUS
  Administered 2013-02-15: 1 mg via INTRAVENOUS
  Administered 2013-02-15: 2 mg via INTRAVENOUS
  Administered 2013-02-15: 1 mg via INTRAVENOUS
  Administered 2013-02-15 – 2013-02-16 (×2): 2 mg via INTRAVENOUS
  Administered 2013-02-16: 1 mg via INTRAVENOUS
  Filled 2013-02-14 (×2): qty 1
  Filled 2013-02-14 (×2): qty 2
  Filled 2013-02-14 (×5): qty 1
  Filled 2013-02-14 (×2): qty 2

## 2013-02-14 MED ORDER — KCL IN DEXTROSE-NACL 20-5-0.45 MEQ/L-%-% IV SOLN
INTRAVENOUS | Status: DC
  Administered 2013-02-14: 17:00:00 via INTRAVENOUS
  Administered 2013-02-14 – 2013-02-15 (×2): 125 mL/h via INTRAVENOUS
  Administered 2013-02-15 (×2): via INTRAVENOUS
  Filled 2013-02-14 (×7): qty 1000

## 2013-02-14 MED ORDER — ROCURONIUM BROMIDE 100 MG/10ML IV SOLN
INTRAVENOUS | Status: DC | PRN
  Administered 2013-02-14: 25 mg via INTRAVENOUS

## 2013-02-14 MED ORDER — PANTOPRAZOLE SODIUM 40 MG IV SOLR
40.0000 mg | INTRAVENOUS | Status: DC
  Administered 2013-02-14 – 2013-02-17 (×4): 40 mg via INTRAVENOUS
  Filled 2013-02-14 (×6): qty 40

## 2013-02-14 MED ORDER — ONDANSETRON HCL 4 MG/2ML IJ SOLN
INTRAMUSCULAR | Status: DC | PRN
  Administered 2013-02-14: 4 mg via INTRAVENOUS

## 2013-02-14 MED ORDER — HYDROMORPHONE HCL PF 1 MG/ML IJ SOLN
INTRAMUSCULAR | Status: AC
  Filled 2013-02-14: qty 1

## 2013-02-14 MED ORDER — OXYCODONE HCL 5 MG PO TABS: 5.0000 mg | ORAL_TABLET | Freq: Once | ORAL | Status: DC | PRN

## 2013-02-14 MED ORDER — LIDOCAINE HCL (CARDIAC) 20 MG/ML IV SOLN
INTRAVENOUS | Status: DC | PRN
  Administered 2013-02-14: 100 mg via INTRAVENOUS

## 2013-02-14 MED ORDER — OXYCODONE HCL 5 MG/5ML PO SOLN: 5.0000 mg | Freq: Once | ORAL | Status: DC | PRN

## 2013-02-14 MED ORDER — DEXAMETHASONE SODIUM PHOSPHATE 4 MG/ML IJ SOLN
INTRAMUSCULAR | Status: DC | PRN
  Administered 2013-02-14: 4 mg via INTRAVENOUS

## 2013-02-14 MED ORDER — OXYCODONE-ACETAMINOPHEN 5-325 MG PO TABS
1.0000 | ORAL_TABLET | ORAL | Status: DC | PRN
  Administered 2013-02-14 (×3): 2 via ORAL
  Administered 2013-02-14: 1 via ORAL
  Administered 2013-02-15 – 2013-02-18 (×10): 2 via ORAL
  Filled 2013-02-14 (×14): qty 2

## 2013-02-14 MED ORDER — ONDANSETRON HCL 4 MG PO TABS: 4.0000 mg | ORAL_TABLET | Freq: Four times a day (QID) | ORAL | Status: DC | PRN

## 2013-02-14 MED ORDER — MIDAZOLAM HCL 5 MG/5ML IJ SOLN
INTRAMUSCULAR | Status: DC | PRN
  Administered 2013-02-13: 2 mg via INTRAVENOUS

## 2013-02-14 MED ORDER — SUCCINYLCHOLINE CHLORIDE 20 MG/ML IJ SOLN
INTRAMUSCULAR | Status: DC | PRN
  Administered 2013-02-14: 100 mg via INTRAVENOUS

## 2013-02-14 MED ORDER — NEOSTIGMINE METHYLSULFATE 1 MG/ML IJ SOLN
INTRAMUSCULAR | Status: DC | PRN
  Administered 2013-02-14: 3 mg via INTRAVENOUS

## 2013-02-14 MED ORDER — GLYCOPYRROLATE 0.2 MG/ML IJ SOLN
INTRAMUSCULAR | Status: DC | PRN
  Administered 2013-02-14: 0.4 mg via INTRAVENOUS

## 2013-02-14 MED ORDER — HYDROMORPHONE HCL PF 1 MG/ML IJ SOLN
0.2500 mg | INTRAMUSCULAR | Status: DC | PRN
  Administered 2013-02-14 (×2): 0.5 mg via INTRAVENOUS

## 2013-02-14 MED ORDER — ONDANSETRON HCL 4 MG/2ML IJ SOLN
4.0000 mg | Freq: Four times a day (QID) | INTRAMUSCULAR | Status: DC | PRN
  Administered 2013-02-15 – 2013-02-16 (×2): 4 mg via INTRAVENOUS
  Filled 2013-02-14 (×2): qty 2

## 2013-02-14 MED ORDER — PROPOFOL 10 MG/ML IV BOLUS
INTRAVENOUS | Status: DC | PRN
  Administered 2013-02-14: 130 mg via INTRAVENOUS

## 2013-02-14 MED ORDER — SUFENTANIL CITRATE 50 MCG/ML IV SOLN
INTRAVENOUS | Status: DC | PRN
  Administered 2013-02-14: 20 ug via INTRAVENOUS

## 2013-02-14 NOTE — Anesthesia Postprocedure Evaluation (Signed)
  Anesthesia Post-op Note  Patient: Willie [name]  Procedure(s) Performed: Procedure(s): APPENDECTOMY LAPAROSCOPIC (N/A)  Patient Location: PACU  Anesthesia Type:General  Level of Consciousness: awake and alert   Airway and Oxygen Therapy: Patient Spontanous Breathing  Post-op Pain: moderate  Post-op Assessment: Post-op Vital signs reviewed, Patient's Cardiovascular Status Stable and Respiratory Function Stable  Post-op Vital Signs: Reviewed  Filed Vitals:   02/14/13 0500  BP: 112/63  Pulse: 77  Temp: 36.6 C  Resp: 16    Complications: No apparent anesthesia complications

## 2013-02-14 NOTE — Transfer of Care (Signed)
Immediate Anesthesia Transfer of Care Note  Patient: Willie [name]  Procedure(s) Performed: Procedure(s): APPENDECTOMY LAPAROSCOPIC (N/A)  Patient Location: PACU  Anesthesia Type:General  Level of Consciousness: oriented, sedated, patient cooperative and responds to stimulation  Airway & Oxygen Therapy: Patient Spontanous Breathing and Patient connected to nasal cannula oxygen  Post-op Assessment: Report given to PACU RN, Post -op Vital signs reviewed and stable and Patient moving all extremities X 4  Post vital signs: Reviewed and stable  Complications: No apparent anesthesia complications

## 2013-02-14 NOTE — Op Note (Addendum)
OPERATIVE REPORT  DATE OF OPERATION:  02/14/2013  PATIENT:  Willie [name]  42 y.[name]. male  PRE-OPERATIVE DIAGNOSIS:  Acute appendicitis  POST-OPERATIVE DIAGNOSIS:  Acute appendicitis with rupture and peritonitis  PROCEDURE:  Procedure(s): APPENDECTOMY LAPAROSCOPIC  SURGEON:  Surgeon(s): Cherylynn Ridges, MD  ASSISTANT: None  ANESTHESIA:   general  EBL: <50 ml  BLOOD ADMINISTERED: none  DRAINS: none   SPECIMEN:  Source of Specimen:  Rutured appendix  COUNTS CORRECT:  YES  PROCEDURE DETAILS: The patient was taken to the operating room and placed on the table in the supine position. After an adequate general endotracheal anesthetic was administered, he was prepped and draped in usual sterile manner exposing his entire abdomen.  After proper time out was performed identifying the patient and the procedure to be performed, a supraumbilical midline incision was made using a #15 blade. This was taken down to the midline fascia. The fascia was incised with a #15 blade. We subsequently bluntly dissected down into the peritoneal cavity. A pursestring suture of 0 Vicryl was passed around the fascial opening. A Hassan cannula was passed into the peritoneal cavity and secured in place with the pursestring suture.  Right upper quadrant 5 mm cannula and the left lower quadrant 12 mm cannula placed under direct vision with the patient in Trendelenburg position. Immediately upon inserting the laparoscope purulent fluid was noted in the right lower quadrant. The patient was febrile prior to surgery. Pictures were taken of the acutely inflamed and perforated appendix with purulent drainage and peritonitis in the right lower quadrant.  We dissected out the appendix at the base of the cecum. We came across the base using an Endo GIA with a 3.5 mm blue cartridge. We subsequently used a wide cartridge 2.5 mm Endo GIA across the mesoappendix. There was some bleeding subsequently the had to be controlled with  electrocautery.    We irrigated with approximately 2-1/2 L of saline solution. The appendix was retrieved through the left lower quadrant cannula site using an Endo Catch bag. The patient was given intraoperative metronidazole because of the perforation.  Once we irrigated with copious amounts of we removed the supraumbilical cannula and tied off the fascia using a Pershing suture that was in place. The other cannulas were removed after all fluid and gas was aspirated from the peritoneal cavity.  Quarter percent Marcaine was injected at all sites. The left lower quadrant in the supraumbilical skin sites were closed using a running subcuticular stitch of 4-0 Monocryl. Dermabond Steri-Strips and Tegaderms use complete all dressings. All needle counts, sponge counts, and instrument counts were correct.  PATIENT DISPOSITION:  PACU - hemodynamically stable.   Willie [name] 11/1/20141:21 AM

## 2013-02-14 NOTE — Progress Notes (Signed)
1 Day Post-Op   Assessment: s/p Procedure(s): APPENDECTOMY LAPAROSCOPIC Patient Active Problem List   Diagnosis Date Noted  . Acute fulminating appendicitis with perforation and peritonitis 02/13/2013  . Left knee pain 01/22/2011    Improved from pre-op  Plan: No change today, continue antibiotics due to pre op infection, will not advance diet yet to be sure does not have ileus from the perforated appendix;encouraged ambulation Will recheck cbc in am   Subjective: Feels better than pre-op, no nausea and taking clears OK  Objective: Vital signs in last 24 hours: Temp:  [97.9 F (36.6 C)-98.9 F (37.2 C)] 97.9 F (36.6 C) (11/01 0500) Pulse Rate:  [77-103] 77 (11/01 0500) Resp:  [12-17] 16 (11/01 0500) BP: (103-121)/(54-70) 112/63 mmHg (11/01 0500) SpO2:  [93 %-99 %] 98 % (11/01 0500) Weight:  [170 lb (77.111 kg)] 170 lb (77.111 kg) (10/31 2354)   Intake/Output from previous day: 10/31 0701 - 11/01 0700 In: 1241.7 [I.V.:1241.7] Out: 1851 [Urine:1850; Stool:1]  General appearance: alert, cooperative and no distress Resp: clear to auscultation bilaterally GI: Soft and not distended or tender.  Incision: no significant drainage  Lab Results:   Recent Labs  02/13/13 1840 02/14/13 0500  WBC 19.1* 20.7*  HGB 17.0 15.5  HCT 46.8 44.2  PLT 231 218   BMET  Recent Labs  02/13/13 1840 02/14/13 0500  NA 139 141  K 3.9 4.1  CL 104 108  CO2 23 25  GLUCOSE 111* 154*  BUN 13 11  CREATININE 1.00 1.01  CALCIUM 9.1 8.9    MEDS, Scheduled . ciprofloxacin  400 mg Intravenous Q12H  . HYDROmorphone      . metronidazole  500 mg Intravenous Q8H  . nicotine  21 mg Transdermal Daily  . pantoprazole (PROTONIX) IV  40 mg Intravenous Q24H    Studies/Results: Ct Abdomen Pelvis W Contrast  02/13/2013   CLINICAL DATA:  Lower abdominal pain  EXAM: CT ABDOMEN AND PELVIS WITH CONTRAST  TECHNIQUE: Multidetector CT imaging of the abdomen and pelvis was performed using the  standard protocol following bolus administration of intravenous contrast.  CONTRAST:  OMNIPAQUE IOHEXOL 300 MG/ML  SOLN  COMPARISON:  None.  FINDINGS: Lung bases are free of acute infiltrate or sizable effusion.  The liver, spleen, adrenal glands and pancreas are normal in their CT appearance. The gallbladder is well distended and demonstrates multiple gallstones. No complicating factors are seen. The kidneys are well visualized and reveal a normal enhancement pattern. Normal excretion of contrast is noted on delayed imaging. In the right lower quadrant the appendix is mildly enlarged and periappendiceal inflammatory changes are seen consistent with acute appendicitis. A few small foci of air are noted adjacent to the appendix which may represent micro-perforations although this is difficult to evaluate on this exam. Diffuse diverticular change is noted without evidence of diverticulitis. The bladder is well distended.  IMPRESSION: Changes consistent with appendicitis. A few small foci of air are noted. The possibility of micro-perforation cannot be totally excluded.   Electronically Signed   By: Alcide Clever M.D.   On: 02/13/2013 21:15      LOS: 1 day     Currie Paris, MD, [facility], [place] [phone]   02/14/2013 10:24 AM

## 2013-02-15 LAB — URINALYSIS, ROUTINE W REFLEX MICROSCOPIC
Bilirubin Urine: NEGATIVE
Glucose, UA: NEGATIVE mg/dL
Hgb urine dipstick: NEGATIVE
Ketones, ur: NEGATIVE mg/dL
Protein, ur: NEGATIVE mg/dL
Urobilinogen, UA: 0.2 mg/dL (ref 0.0–1.0)

## 2013-02-15 LAB — CBC
HCT: 43 % (ref 39.0–52.0)
Hemoglobin: 14.9 g/dL (ref 13.0–17.0)
MCH: 32.7 pg (ref 26.0–34.0)
MCHC: 34.7 g/dL (ref 30.0–36.0)
RDW: 12.6 % (ref 11.5–15.5)
WBC: 16.3 10*3/uL — ABNORMAL HIGH (ref 4.0–10.5)

## 2013-02-15 MED ORDER — PNEUMOCOCCAL VAC POLYVALENT 25 MCG/0.5ML IJ INJ
0.5000 mL | INJECTION | INTRAMUSCULAR | Status: AC
  Administered 2013-02-16: 0.5 mL via INTRAMUSCULAR
  Filled 2013-02-15: qty 0.5

## 2013-02-15 NOTE — Progress Notes (Signed)
2 Days Post-Op   Assessment: s/p Procedure(s): APPENDECTOMY LAPAROSCOPIC Patient Active Problem List   Diagnosis Date Noted  . Acute fulminating appendicitis with perforation and peritonitis 02/13/2013  . Left knee pain 01/22/2011    Stable post op, seems to be improving except for some increase in abd pain today  Plan: Advance diet Will check U/A  Subjective: No nausea, voiding but burning on urination, passing gas, wants more to eat, more pain today than yesterday.  Objective: Vital signs in last 24 hours: Temp:  [96.8 F (36 C)-98.1 F (36.7 C)] 98 F (36.7 C) (11/02 0528) Pulse Rate:  [60-72] 68 (11/02 0702) Resp:  [18] 18 (11/02 0528) BP: (89-115)/(24-75) 106/61 mmHg (11/02 0702) SpO2:  [95 %-99 %] 97 % (11/02 0528)   Intake/Output from previous day: 11/01 0701 - 11/02 0700 In: 3160 [P.O.:360; I.V.:2500; IV Piggyback:300] Out: 900 [Urine:900]  General appearance: alert, cooperative and no distress Resp: clear to auscultation bilaterally GI: Soft, mild diffuse tender, no rebound  Incision: healing well, no significant drainage  Lab Results:   Recent Labs  02/14/13 0500 02/15/13 0547  WBC 20.7* 16.3*  HGB 15.5 14.9  HCT 44.2 43.0  PLT 218 208   BMET  Recent Labs  02/13/13 1840 02/14/13 0500  NA 139 141  K 3.9 4.1  CL 104 108  CO2 23 25  GLUCOSE 111* 154*  BUN 13 11  CREATININE 1.00 1.01  CALCIUM 9.1 8.9    MEDS, Scheduled . ciprofloxacin  400 mg Intravenous Q12H  . influenza vac split quadrivalent PF  0.5 mL Intramuscular Tomorrow-1000  . metronidazole  500 mg Intravenous Q8H  . nicotine  21 mg Transdermal Daily  . pantoprazole (PROTONIX) IV  40 mg Intravenous Q24H    Studies/Results: Ct Abdomen Pelvis W Contrast  02/13/2013   CLINICAL DATA:  Lower abdominal pain  EXAM: CT ABDOMEN AND PELVIS WITH CONTRAST  TECHNIQUE: Multidetector CT imaging of the abdomen and pelvis was performed using the standard protocol following bolus  administration of intravenous contrast.  CONTRAST:  OMNIPAQUE IOHEXOL 300 MG/ML  SOLN  COMPARISON:  None.  FINDINGS: Lung bases are free of acute infiltrate or sizable effusion.  The liver, spleen, adrenal glands and pancreas are normal in their CT appearance. The gallbladder is well distended and demonstrates multiple gallstones. No complicating factors are seen. The kidneys are well visualized and reveal a normal enhancement pattern. Normal excretion of contrast is noted on delayed imaging. In the right lower quadrant the appendix is mildly enlarged and periappendiceal inflammatory changes are seen consistent with acute appendicitis. A few small foci of air are noted adjacent to the appendix which may represent micro-perforations although this is difficult to evaluate on this exam. Diffuse diverticular change is noted without evidence of diverticulitis. The bladder is well distended.  IMPRESSION: Changes consistent with appendicitis. A few small foci of air are noted. The possibility of micro-perforation cannot be totally excluded.   Electronically Signed   By: Alcide Clever M.D.   On: 02/13/2013 21:15      LOS: 2 days     Currie Paris, MD, [facility], [place] [phone]   02/15/2013 8:42 AM

## 2013-02-16 LAB — CBC
HCT: 43.1 % (ref 39.0–52.0)
Hemoglobin: 15.1 g/dL (ref 13.0–17.0)
MCH: 33 pg (ref 26.0–34.0)
MCHC: 35 g/dL (ref 30.0–36.0)
RBC: 4.57 MIL/uL (ref 4.22–5.81)
RDW: 12.5 % (ref 11.5–15.5)

## 2013-02-16 MED ORDER — OXYMETAZOLINE HCL 0.05 % NA SOLN
1.0000 | Freq: Two times a day (BID) | NASAL | Status: DC | PRN
  Filled 2013-02-16: qty 15

## 2013-02-16 MED ORDER — VANCOMYCIN HCL IN DEXTROSE 1-5 GM/200ML-% IV SOLN
1000.0000 mg | Freq: Two times a day (BID) | INTRAVENOUS | Status: DC
  Administered 2013-02-16 – 2013-02-18 (×5): 1000 mg via INTRAVENOUS
  Filled 2013-02-16 (×6): qty 200

## 2013-02-16 MED ORDER — POLYETHYLENE GLYCOL 3350 17 G PO PACK
17.0000 g | PACK | Freq: Every day | ORAL | Status: DC
  Administered 2013-02-16 – 2013-02-17 (×2): 17 g via ORAL
  Filled 2013-02-16 (×3): qty 1

## 2013-02-16 MED ORDER — SODIUM CHLORIDE 0.9 % IJ SOLN: 3.0000 mL | INTRAMUSCULAR | Status: DC | PRN

## 2013-02-16 MED ORDER — ENOXAPARIN SODIUM 40 MG/0.4ML ~~LOC~~ SOLN
40.0000 mg | SUBCUTANEOUS | Status: DC
  Administered 2013-02-16 – 2013-02-18 (×3): 40 mg via SUBCUTANEOUS
  Filled 2013-02-16 (×4): qty 0.4

## 2013-02-16 MED ORDER — SODIUM CHLORIDE 0.9 % IJ SOLN
3.0000 mL | Freq: Two times a day (BID) | INTRAMUSCULAR | Status: DC
  Administered 2013-02-16 – 2013-02-17 (×2): 3 mL via INTRAVENOUS

## 2013-02-16 MED ORDER — SODIUM CHLORIDE 0.9 % IV SOLN
250.0000 mL | INTRAVENOUS | Status: DC | PRN
  Administered 2013-02-16: 250 mL via INTRAVENOUS

## 2013-02-16 MED ORDER — KETOROLAC TROMETHAMINE 15 MG/ML IJ SOLN
15.0000 mg | Freq: Four times a day (QID) | INTRAMUSCULAR | Status: DC | PRN
  Administered 2013-02-16: 30 mg via INTRAVENOUS
  Administered 2013-02-16: 15 mg via INTRAVENOUS
  Administered 2013-02-17: 30 mg via INTRAVENOUS
  Administered 2013-02-17 (×2): 15 mg via INTRAVENOUS
  Filled 2013-02-16: qty 2
  Filled 2013-02-16 (×2): qty 1
  Filled 2013-02-16: qty 2
  Filled 2013-02-16: qty 1

## 2013-02-16 NOTE — Progress Notes (Signed)
ANTIBIOTIC CONSULT NOTE - INITIAL  Pharmacy Consult for vancomycin Indication: cellulitis  Allergies  Allergen Reactions  . Penicillins     Unknown reaction    Patient Measurements: Height: 5\' 10"  (177.8 cm) Weight: 170 lb (77.111 kg) IBW/kg (Calculated) : 73   Vital Signs: Temp: 98.2 F (36.8 C) (11/03 1000) Temp src: Oral (11/03 1000) BP: 109/62 mmHg (11/03 1000) Pulse Rate: 86 (11/03 1000) Intake/Output from previous day: 11/02 0701 - 11/03 0700 In: 1456 [P.O.:840; I.V.:616] Out: 300 [Urine:300] Intake/Output from this shift: Total I/O In: -  Out: 400 [Urine:400]  Labs:  Recent Labs  02/13/13 1840 02/14/13 0500 02/15/13 0547 02/16/13 0515  WBC 19.1* 20.7* 16.3* 11.9*  HGB 17.0 15.5 14.9 15.1  PLT 231 218 208 207  CREATININE 1.00 1.01  --   --    Estimated Creatinine Clearance: 96.4 ml/min (by C-G formula based on Cr of 1.01). No results found for this basename: VANCOTROUGH, VANCOPEAK, VANCORANDOM, GENTTROUGH, GENTPEAK, GENTRANDOM, TOBRATROUGH, TOBRAPEAK, TOBRARND, AMIKACINPEAK, AMIKACINTROU, AMIKACIN,  in the last 72 hours   Microbiology: No results found for this or any previous visit (from the past 720 hour(s)).  Medical History: Past Medical History  Diagnosis Date  . Acute fulminating appendicitis with perforation and peritonitis 02/13/2013    Lap appy on [id number]   Assessment: 42 YOM s/p lap appy for appendicitis with perf. On Flagyl and cipro for empiric coverage of appendicitis. Now with erythema around surgical site concerning for cellulitis and to start vancomycin. SCr 1 with CrCl ~105mL/min. WBC 11.9, Tmax/24h 100.  Goal of Therapy:  Vancomycin trough level 10-15 mcg/ml  Plan:  1. Vancomycin 1000mg  IV q12h 2. Follow up renal function, clinical progression, C/S, trough at SS, LOT  Chantrice Hagg D. Jorell Agne, PharmD Clinical Pharmacist Pager: [phone] 02/16/2013 12:18 PM

## 2013-02-16 NOTE — Progress Notes (Signed)
3 Days Post-Op  Subjective: Pain better, +flatus, no bm yet.  Tolerating full liquids.  Ambulating, using IS.  Objective: Vital signs in last 24 hours: Temp:  [97.6 F (36.4 C)-100 F (37.8 C)] 100 F (37.8 C) (11/03 0521) Pulse Rate:  [89-101] 101 (11/03 0521) Resp:  [18-19] 19 (11/03 0521) BP: (96-124)/(61-72) 111/72 mmHg (11/03 0521) SpO2:  [95 %-99 %] 95 % (11/03 0521) Last BM Date: 02/13/13  Intake/Output from previous day: 11/02 0701 - 11/03 0700 In: 1456 [P.O.:840; I.V.:616] Out: 300 [Urine:300] Intake/Output this shift:    General appearance: alert, cooperative and no distress GI: +bs abdomen is mildy distended, soft.  TTP left side with erythema surrounding the incision site and extending to the flank, no fluctuance or induration to suggest an abscess.  umbilical and RUQ site without erythema or drainage.  no evidence of peritonitis.   Lab Results:   Recent Labs  02/15/13 0547 02/16/13 0515  WBC 16.3* 11.9*  HGB 14.9 15.1  HCT 43.0 43.1  PLT 208 207   BMET  Recent Labs  02/13/13 1840 02/14/13 0500  NA 139 141  K 3.9 4.1  CL 104 108  CO2 23 25  GLUCOSE 111* 154*  BUN 13 11  CREATININE 1.00 1.01  CALCIUM 9.1 8.9    Anti-infectives: Anti-infectives   Start     Dose/Rate Route Frequency Ordered Stop   02/14/13 0245  metroNIDAZOLE (FLAGYL) IVPB 500 mg     500 mg 100 mL/hr over 60 Minutes Intravenous Every 8 hours 02/14/13 0238     02/14/13 0245  ciprofloxacin (CIPRO) IVPB 400 mg     400 mg 200 mL/hr over 60 Minutes Intravenous Every 12 hours 02/14/13 0238     02/14/13 0045  metroNIDAZOLE (FLAGYL) IVPB 500 mg     500 mg 100 mL/hr over 60 Minutes Intravenous  Once 02/14/13 0030 02/14/13 0042   02/13/13 2315  [MAR Hold]  ciprofloxacin (CIPRO) IVPB 400 mg     (On MAR Hold since 02/14/13 0005)   400 mg 200 mL/hr over 60 Minutes Intravenous On call to O.R. 02/13/13 2257 02/14/13 0015      Assessment/Plan: Acute appendicitis with perforation S/p  Lap Appy 02/14/13 Dr. Lindie Spruce  I am concerned with erythema on the left which is new from yesterday.  There no evident abscess.  He is on cipro/flagyl.  Will monitor. -advance diet -add toradol -ambulate, SCD, add lovenox -continue with atbx -add miralax    LOS: 3 days    Willie [name] ANP-BC 02/16/2013 8:18 AM

## 2013-02-16 NOTE — Progress Notes (Signed)
I have seen and examined the patient and agree with the assessment and plans. Postop ileus.  Will also watch wound.  Natania Finigan A. Magnus Ivan  MD, FACS

## 2013-02-17 ENCOUNTER — Encounter (HOSPITAL_COMMUNITY): Payer: Self-pay | Admitting: General Surgery

## 2013-02-17 LAB — CBC
HCT: 40.6 % (ref 39.0–52.0)
Hemoglobin: 14.5 g/dL (ref 13.0–17.0)
MCV: 93.5 fL (ref 78.0–100.0)
RBC: 4.34 MIL/uL (ref 4.22–5.81)
RDW: 12.3 % (ref 11.5–15.5)
WBC: 12.7 10*3/uL — ABNORMAL HIGH (ref 4.0–10.5)

## 2013-02-17 MED ORDER — PANTOPRAZOLE SODIUM 40 MG PO TBEC
40.0000 mg | DELAYED_RELEASE_TABLET | Freq: Every day | ORAL | Status: DC
  Administered 2013-02-17: 40 mg via ORAL
  Filled 2013-02-17: qty 1

## 2013-02-17 NOTE — Progress Notes (Signed)
I have seen and examined the patient and agree with the assessment and plans.  Konstantina Nachreiner A. Adelei Scobey  MD, FACS  

## 2013-02-17 NOTE — Progress Notes (Signed)
4 Days Post-Op  Subjective: Had nausea yesterday after miralax. Denies vomiting.  +flatus, no bm.  Pain is a little better today.  Denies chills or sweats.    Objective: Vital signs in last 24 hours: Temp:  [97.7 F (36.5 C)-98.7 F (37.1 C)] 97.7 F (36.5 C) (11/04 0865) Pulse Rate:  [79-86] 80 (11/04 0613) Resp:  [18] 18 (11/04 0613) BP: (96-116)/(52-69) 107/60 mmHg (11/04 0613) SpO2:  [95 %-98 %] 97 % (11/04 0613) Last BM Date: 02/13/13  Intake/Output from previous day: 11/03 0701 - 11/04 0700 In: 2800 [P.O.:240; I.V.:60; IV Piggyback:2500] Out: 700 [Urine:700] Intake/Output this shift:   Physical Exam: General appearance: alert, cooperative and no distress  GI: +bs abdomen is mildy distended, soft. TTP left side with erythema surrounding the incision site and extending to the flank, no fluctuance or induration to suggest an abscess, flank erythema has extended, abdominal aspect erythema has decreased. umbilical and RUQ site without erythema or drainage. no evidence of peritonitis.    Lab Results:   Recent Labs  02/16/13 0515 02/17/13 0707  WBC 11.9* 12.7*  HGB 15.1 14.5  HCT 43.1 40.6  PLT 207 221   BMET No results found for this basename: NA, K, CL, CO2, GLUCOSE, BUN, CREATININE, CALCIUM,  in the last 72 hours PT/INR No results found for this basename: LABPROT, INR,  in the last 72 hours ABG No results found for this basename: PHART, PCO2, PO2, HCO3,  in the last 72 hours  Studies/Results: No results found.  Anti-infectives: Anti-infectives   Start     Dose/Rate Route Frequency Ordered Stop   02/16/13 1300  vancomycin (VANCOCIN) IVPB 1000 mg/200 mL premix     1,000 mg 200 mL/hr over 60 Minutes Intravenous Every 12 hours 02/16/13 1218     02/14/13 0245  metroNIDAZOLE (FLAGYL) IVPB 500 mg     500 mg 100 mL/hr over 60 Minutes Intravenous Every 8 hours 02/14/13 0238     02/14/13 0245  ciprofloxacin (CIPRO) IVPB 400 mg     400 mg 200 mL/hr over 60 Minutes  Intravenous Every 12 hours 02/14/13 0238     02/14/13 0045  metroNIDAZOLE (FLAGYL) IVPB 500 mg     500 mg 100 mL/hr over 60 Minutes Intravenous  Once 02/14/13 0030 02/14/13 0042   02/13/13 2315  [MAR Hold]  ciprofloxacin (CIPRO) IVPB 400 mg     (On MAR Hold since 02/14/13 0005)   400 mg 200 mL/hr over 60 Minutes Intravenous On call to O.R. 02/13/13 2257 02/14/13 0015      Assessment/Plan: Acute appendicitis with perforation  S/p Lap Appy 02/14/13 Dr. Lindie Spruce Cellulitis of skin, surgical site  -Continue with cipro/flagyl -Continue with vanc for the cellulitis -Pain is fairly well controlled with current regimen -Ambulate, SCDs, lovenox -IS   LOS: 4 days    Ethelyne Erich ANP-BC 02/17/2013 8:05 AM

## 2013-02-18 LAB — BASIC METABOLIC PANEL
BUN: 16 mg/dL (ref 6–23)
Calcium: 8.5 mg/dL (ref 8.4–10.5)
Creatinine, Ser: 1.06 mg/dL (ref 0.50–1.35)
GFR calc non Af Amer: 84 mL/min — ABNORMAL LOW (ref >90)
Glucose, Bld: 93 mg/dL (ref 70–99)
Potassium: 3.5 mEq/L (ref 3.5–5.1)

## 2013-02-18 LAB — CBC
HCT: 41.6 % (ref 39.0–52.0)
Hemoglobin: 14.4 g/dL (ref 13.0–17.0)
MCH: 32.7 pg (ref 26.0–34.0)
MCHC: 34.6 g/dL (ref 30.0–36.0)
MCV: 94.3 fL (ref 78.0–100.0)
Platelets: 251 10*3/uL (ref 150–400)

## 2013-02-18 MED ORDER — DOXYCYCLINE HYCLATE 100 MG PO TABS: 100.0000 mg | ORAL_TABLET | Freq: Two times a day (BID) | ORAL | Status: DC

## 2013-02-18 MED ORDER — OXYCODONE-ACETAMINOPHEN 5-325 MG PO TABS: 1.0000 | ORAL_TABLET | ORAL | Status: DC | PRN

## 2013-02-18 MED ORDER — SENNOSIDES-DOCUSATE SODIUM 8.6-50 MG PO TABS
1.0000 | ORAL_TABLET | Freq: Two times a day (BID) | ORAL | Status: DC
  Administered 2013-02-18: 1 via ORAL
  Filled 2013-02-18: qty 1

## 2013-02-18 MED ORDER — SENNOSIDES-DOCUSATE SODIUM 8.6-50 MG PO TABS: 1.0000 | ORAL_TABLET | Freq: Two times a day (BID) | ORAL | Status: DC

## 2013-02-18 MED ORDER — METRONIDAZOLE 500 MG PO TABS: 500.0000 mg | ORAL_TABLET | Freq: Three times a day (TID) | ORAL | Status: DC

## 2013-02-18 NOTE — Progress Notes (Signed)
5 Days Post-Op  Subjective: Pt doing better, not using much pain meds.  +flatus, no  Bm.  Ambulating.  Denies chills or sweats.  Objective: Vital signs in last 24 hours: Temp:  [97.8 F (36.6 C)-98.2 F (36.8 C)] 98.2 F (36.8 C) (11/05 0516) Pulse Rate:  [76-91] 76 (11/05 0516) Resp:  [18-19] 18 (11/05 0516) BP: (104-116)/(66-67) 109/66 mmHg (11/05 0516) SpO2:  [95 %-99 %] 95 % (11/05 0516) Last BM Date: 02/13/13  Intake/Output from previous day: 11/04 0701 - 11/05 0700 In: 560 [I.V.:60; IV Piggyback:500] Out: -  Intake/Output this shift:    Physical Exam:  General appearance: alert, cooperative and no distress  GI: +bs abdomen is mildy distended, soft. No ttp.  Left abdomen and flank erythema has improved.  umbilical and RUQ site without erythema or drainage. no evidence of peritonitis.    Lab Results:   Recent Labs  02/17/13 0707 02/18/13 0522  WBC 12.7* 11.4*  HGB 14.5 14.4  HCT 40.6 41.6  PLT 221 251   BMET  Recent Labs  02/18/13 0522  NA 134*  K 3.5  CL 100  CO2 25  GLUCOSE 93  BUN 16  CREATININE 1.06  CALCIUM 8.5   PT/INR No results found for this basename: LABPROT, INR,  in the last 72 hours ABG No results found for this basename: PHART, PCO2, PO2, HCO3,  in the last 72 hours  Studies/Results: No results found.  Anti-infectives: Anti-infectives   Start     Dose/Rate Route Frequency Ordered Stop   02/16/13 1300  vancomycin (VANCOCIN) IVPB 1000 mg/200 mL premix     1,000 mg 200 mL/hr over 60 Minutes Intravenous Every 12 hours 02/16/13 1218     02/14/13 0245  metroNIDAZOLE (FLAGYL) IVPB 500 mg     500 mg 100 mL/hr over 60 Minutes Intravenous Every 8 hours 02/14/13 0238     02/14/13 0245  ciprofloxacin (CIPRO) IVPB 400 mg     400 mg 200 mL/hr over 60 Minutes Intravenous Every 12 hours 02/14/13 0238     02/14/13 0045  metroNIDAZOLE (FLAGYL) IVPB 500 mg     500 mg 100 mL/hr over 60 Minutes Intravenous  Once 02/14/13 0030 02/14/13 0042   02/13/13 2315  [MAR Hold]  ciprofloxacin (CIPRO) IVPB 400 mg     (On MAR Hold since 02/14/13 0005)   400 mg 200 mL/hr over 60 Minutes Intravenous On call to O.R. 02/13/13 2257 02/14/13 0015      Assessment/Plan: Acute appendicitis with perforation  S/p Lap Appy 02/14/13 Dr. Lindie Spruce  Cellulitis of skin, surgical site  -Continue with cipro/flagyl  -cellulitis has improved, white count trending down -Pain is fairly well controlled with current regimen  -Ambulate, SCDs, lovenox  -IS  Dispo: i will discuss if okay to change to oral antibiotics and discharge home today versus continuing with IV atbx for an additional 24h    LOS: 5 days    Willie [name] ANP-BC 02/18/2013 7:32 AM

## 2013-02-18 NOTE — Progress Notes (Signed)
I have seen and examined the patient and agree with the assessment and plans. Will discharge on oral antibiotics   Willie [name] A. Magnus Ivan  MD, FACS

## 2013-02-18 NOTE — Progress Notes (Signed)
Discharge instructions explained to pt. Prescriptions were given to pt. And medications were reviewed.  Pt. Expressed understanding.  Reviewed with pt. When to call MD.  Discharged to home. Vanice Sarah

## 2013-02-18 NOTE — Discharge Summary (Signed)
Physician Discharge Summary  Willie [name] XLK:[id number] DOB: 1971/02/15 DOA: 02/13/2013  PCP: No PCP Per Patient  Consultation: none  Admit date: 02/13/2013 Discharge date: 02/18/2013  Recommendations for Outpatient Follow-up:   Follow-up Information   Follow up On 03/10/2013.      Follow up with Willie Ridges, MD On 03/03/2013. (ARRIVE BY 8:45AM FOR A 9AM APPT)    Specialty:  General Surgery   Contact information:   [address], [address]  [place] [place] [address] [phone]      Discharge Diagnoses:  1. Acute appendicitis with perforation  2. Surgical site cellulitis    Surgical Procedure:  Laparoscopic appendectomy Dr. Lindie [name] 02/14/13)  Discharge Condition: stable  Disposition: home  Diet recommendation: regular  Filed Weights   02/13/13 2354  Weight: 170 lb (77.111 kg)     Filed Vitals:   02/18/13 0516  BP: 109/66  Pulse: 76  Temp: 98.2 F (36.8 C)  Resp: 18    Hospital Course:  Willie [name] presented to [facility] with RLQ abdominal pain associated with chills.  His work up showed acute appendicitis.  He underwent the procedure listed above.  His appendix was found to be perforated.  He was transferred to the floor post operatively.  He was continued on cipro/flagyl.  His diet was advanced slowly. He was mobilized.  He was found to have erythema on left side of abdomen and flank surrounding surgical site and he was therefore started on Vancomycin.  His white count continued to improve.  His vital signs remaiend stable.  There was no concern for an abscess.  This quickly improved.  On POD #4 he was tolerating a diet, passing flatus, pain well controlled. He was therefore felt stable for discharge with an additional 5 days of flagyl and 14 days of doxycycline. We discussed medication side effects for atbx and pain medication.  He understands that he cannot drink alcohol or drive while taking meds.  We discussed restrictions.  We  discussed warning signs that warrant immediate attention.  He was provided with a follow up with Dr. Lindie [name], he is aware of time and location.    Discharge Instructions   Future Appointments Provider Department Dept Phone   03/03/2013 9:00 AM Willie Ridges, MD [facility] [place] [phone]       Medication List         doxycycline 100 MG tablet  Commonly known as:  VIBRA-TABS  Take 1 tablet (100 mg total) by mouth 2 (two) times daily.     esomeprazole 40 MG capsule  Commonly known as:  NEXIUM  Take 40 mg by mouth daily as needed (for heartburn).     metroNIDAZOLE 500 MG tablet  Commonly known as:  FLAGYL  Take 1 tablet (500 mg total) by mouth 3 (three) times daily.     oxyCODONE-acetaminophen 5-325 MG per tablet  Commonly known as:  PERCOCET/ROXICET  Take 1 tablet by mouth every 4 (four) hours as needed for moderate pain.     senna-docusate 8.6-50 MG per tablet  Commonly known as:  Senokot-S  Take 1 tablet by mouth 2 (two) times daily.           Follow-up Information   Follow up On 03/10/2013.      Follow up with Willie Ridges, MD On 03/03/2013. (ARRIVE BY 8:45AM FOR A 9AM APPT)    Specialty:  General Surgery   Contact information:   [address]  [address]  [facility] [place] [facility] [place] [address] [phone]        The results of significant diagnostics from this hospitalization (including imaging, microbiology, ancillary and laboratory) are listed below for reference.    Significant Diagnostic Studies: Ct Abdomen Pelvis W Contrast  02/13/2013   CLINICAL DATA:  Lower abdominal pain  EXAM: CT ABDOMEN AND PELVIS WITH CONTRAST  TECHNIQUE: Multidetector CT imaging of the abdomen and pelvis was performed using the standard protocol following bolus administration of intravenous contrast.  CONTRAST:  OMNIPAQUE IOHEXOL 300 MG/ML  SOLN  COMPARISON:  None.  FINDINGS: Lung bases are free of acute infiltrate or sizable effusion.  The liver,  spleen, adrenal glands and pancreas are normal in their CT appearance. The gallbladder is well distended and demonstrates multiple gallstones. No complicating factors are seen. The kidneys are well visualized and reveal a normal enhancement pattern. Normal excretion of contrast is noted on delayed imaging. In the right lower quadrant the appendix is mildly enlarged and periappendiceal inflammatory changes are seen consistent with acute appendicitis. A few small foci of air are noted adjacent to the appendix which may represent micro-perforations although this is difficult to evaluate on this exam. Diffuse diverticular change is noted without evidence of diverticulitis. The bladder is well distended.  IMPRESSION: Changes consistent with appendicitis. A few small foci of air are noted. The possibility of micro-perforation cannot be totally excluded.   Electronically Signed   By: Willie [name] M.D.   On: 02/13/2013 21:15    Microbiology: No results found for this or any previous visit (from the past 240 hour(s)).   Labs: Basic Metabolic Panel:  Recent Labs Lab 02/13/13 1840 02/14/13 0500 02/18/13 0522  NA 139 141 134*  K 3.9 4.1 3.5  CL 104 108 100  CO2 23 25 25   GLUCOSE 111* 154* 93  BUN 13 11 16   CREATININE 1.00 1.01 1.06  CALCIUM 9.1 8.9 8.5   Liver Function Tests:  Recent Labs Lab 02/13/13 1840  AST 18  ALT 13  ALKPHOS 81  BILITOT 0.8  PROT 7.1  ALBUMIN 3.8    Recent Labs Lab 02/13/13 1840  LIPASE 16   No results found for this basename: AMMONIA,  in the last 168 hours CBC:  Recent Labs Lab 02/13/13 1840 02/14/13 0500 02/15/13 0547 02/16/13 0515 02/17/13 0707 02/18/13 0522  WBC 19.1* 20.7* 16.3* 11.9* 12.7* 11.4*  NEUTROABS 15.7*  --   --   --   --   --   HGB 17.0 15.5 14.9 15.1 14.5 14.4  HCT 46.8 44.2 43.0 43.1 40.6 41.6  MCV 92.1 95.7 94.5 94.3 93.5 94.3  PLT 231 218 208 207 221 251    Principal Problem:   Acute fulminating appendicitis with perforation  and peritonitis   Signed:  Erma [name], ANP-BC

## 2013-03-03 ENCOUNTER — Encounter (INDEPENDENT_AMBULATORY_CARE_PROVIDER_SITE_OTHER): Payer: Self-pay | Admitting: General Surgery

## 2013-03-03 ENCOUNTER — Ambulatory Visit (INDEPENDENT_AMBULATORY_CARE_PROVIDER_SITE_OTHER): Payer: Medicaid Other | Admitting: General Surgery

## 2013-03-03 VITALS — BP 128/82 | HR 92 | Temp 97.8°F | Resp 15 | Ht 70.0 in | Wt 160.0 lb

## 2013-03-03 DIAGNOSIS — Z09 Encounter for follow-up examination after completed treatment for conditions other than malignant neoplasm: Secondary | ICD-10-CM | POA: Insufficient documentation

## 2013-03-03 MED ORDER — TRAMADOL HCL 50 MG PO TABS: 50.0000 mg | ORAL_TABLET | Freq: Four times a day (QID) | ORAL | Status: AC | PRN

## 2013-03-03 NOTE — Progress Notes (Signed)
He said he continues to have pain especially in the standing position. He is eating well. He is having bowel movements.  On examination his wounds have healed well with no evidence of infection. The patient is at risk for a postoperative abscess because of a ruptured appendix. This does not appear to be the case.  His abdomen is soft minimally tender he has good bowel sounds. He will return to see me on an as-needed basis.

## 2013-03-10 ENCOUNTER — Encounter (INDEPENDENT_AMBULATORY_CARE_PROVIDER_SITE_OTHER): Payer: Self-pay

## 2013-03-15 IMAGING — CT CT CERVICAL SPINE W/O CM
3 of 7 series · 10 of 33 positions shown, 12 images · non-contrast
Comparison: None

CT HEAD

CLINICAL DATA: severe Headache,Trauma, neck pain.

CT HEAD WITHOUT CONTRAST
CT CERVICAL SPINE WITHOUT CONTRAST
TECHNIQUE: Multidetector CT imaging of the head and cervical spine
was performed following the standard protocol without IV contrast.
Multiplanar CT image reconstructions of the cervical spine were
also generated.

[Series 600: sag · sagittal · 0.40mm/px · 5 of 61 slices shown, 6 images]
[im 21/61  bone]
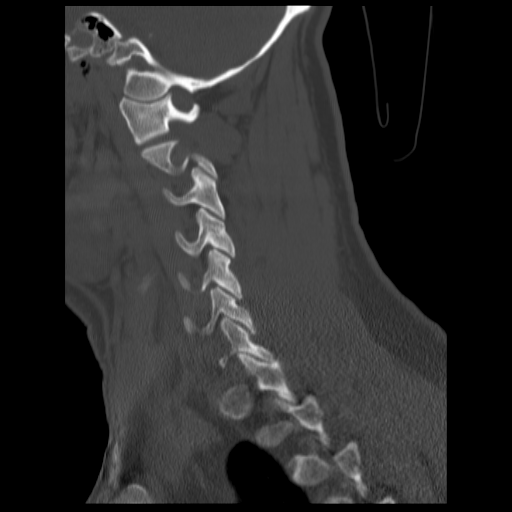
[im 26/61  bone]
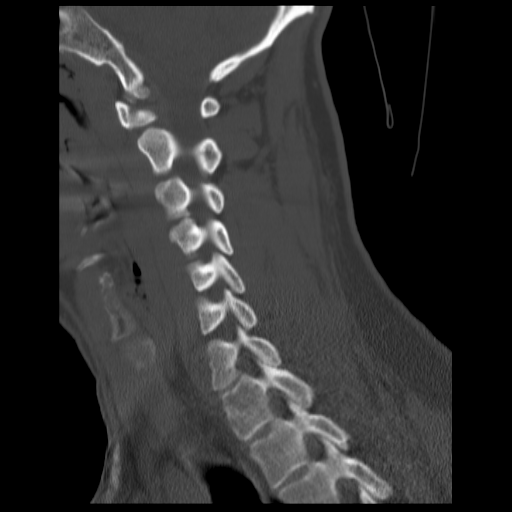
[im 31/61  soft-tissue]
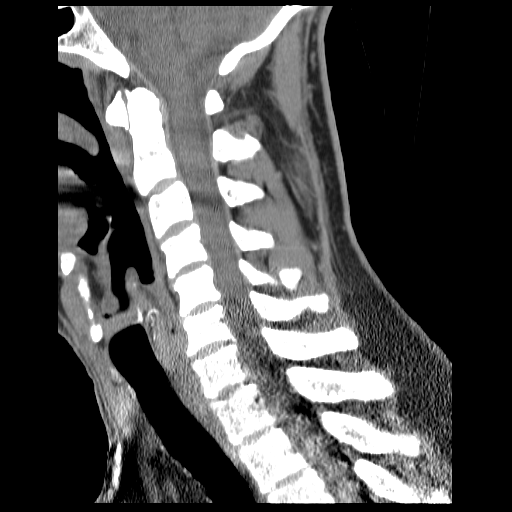
[im 31/61  bone]
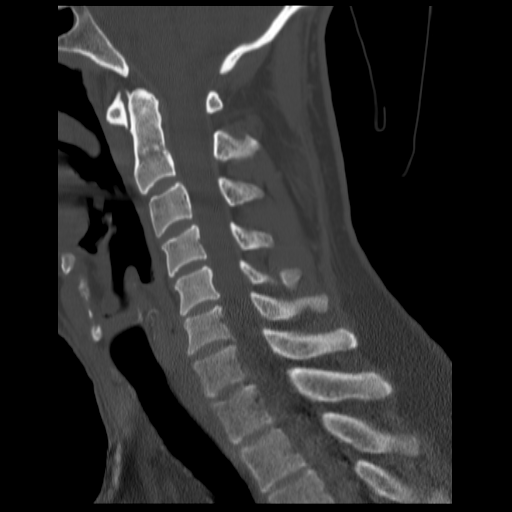
[im 36/61  bone]
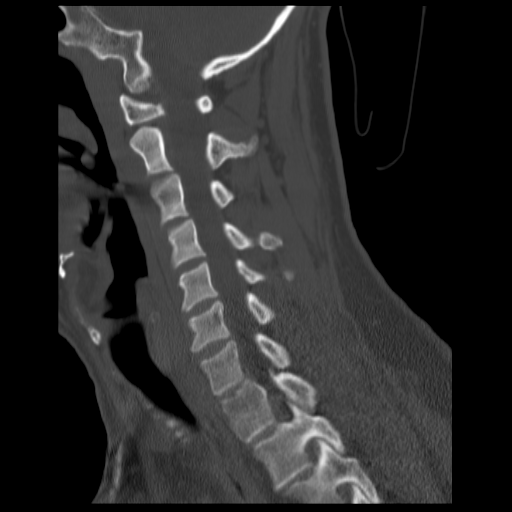
[im 41/61  bone]
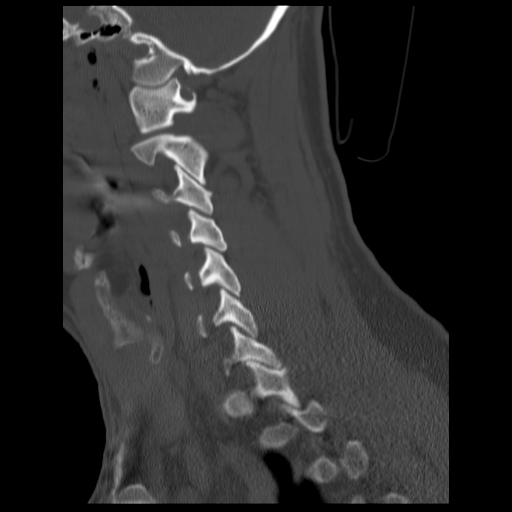

[Series 601: coronal · coronal · 0.40mm/px · 3 of 62 slices shown]
[im 13/62  bone]
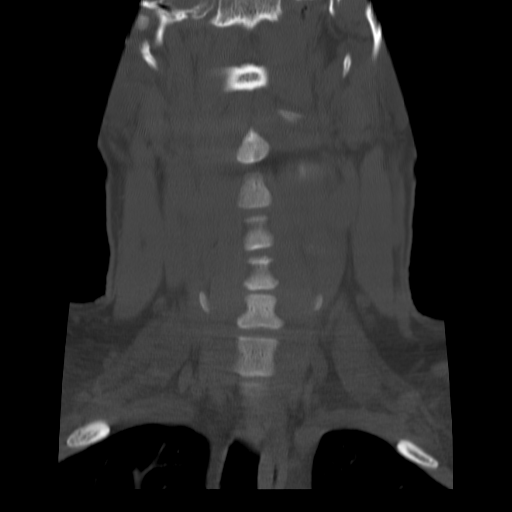
[im 25/62  bone]
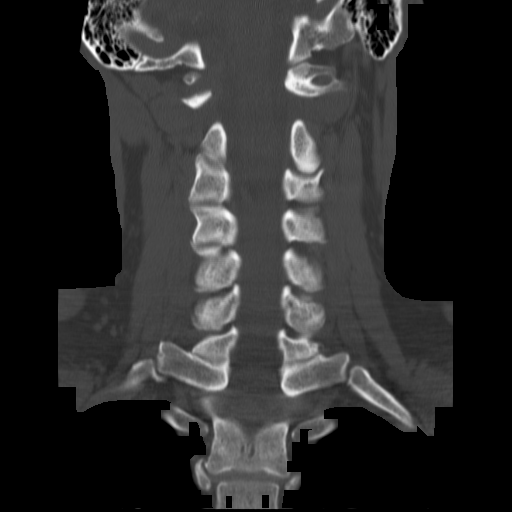
[im 37/62  bone]
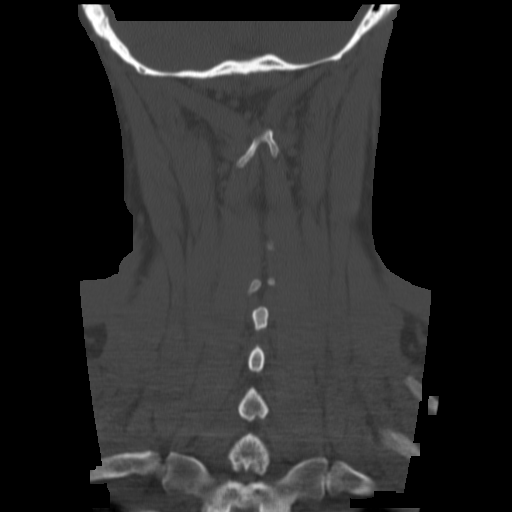

[Series 602: orthog · axial · 0.40mm/px · z∈[-290,-229]mm · 2 of 98 slices shown, 3 images]
[im 33/98  soft-tissue]
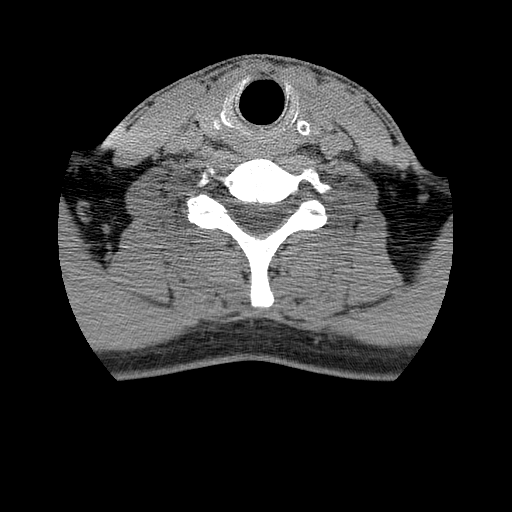
[im 33/98  bone]
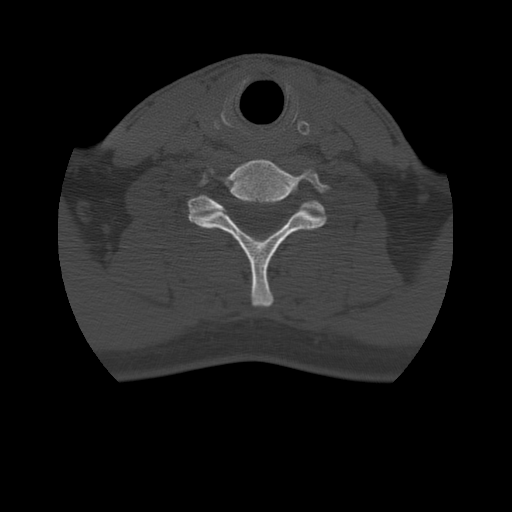
[im 65/98  bone]
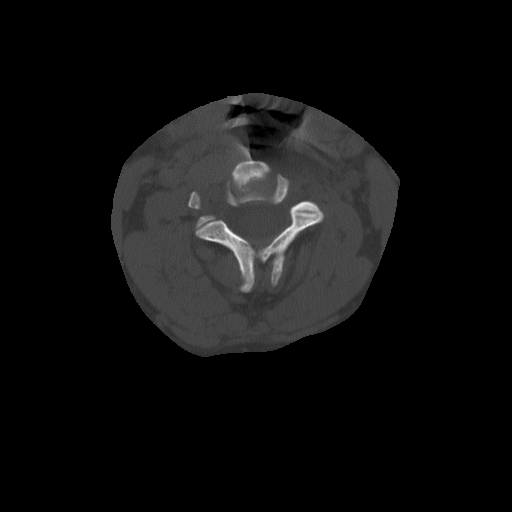

[10 of 33 positions shown; findings below may reference images not displayed]

FINDINGS: Right frontal scalp hematoma. There is no evidence of
acute intracranial hemorrhage, brain edema, mass lesion, acute
infarction,   mass effect, or midline shift. Acute infarct may be
inapparent on noncontrast CT.  No other intra-axial abnormalities
are seen, and the ventricles and sulci are within normal limits in
size and symmetry.   No abnormal extra-axial fluid collections or
masses are identified.  No significant calvarial abnormality.
IMPRESSION: 1. Negative for bleed or other acute intracranial process.

CT CERVICAL SPINE
FINDINGS: Mild narrowing C4-5, C5-6, C6-7 interspaces.  Facets
seated.  No prevertebral soft tissue swelling.  Negative for
fracture.  Paraspinal soft tissues unremarkable.  No significant
osseous degenerative changes.
IMPRESSION: Negative for fracture or other acute abnormality.

## 2016-02-05 ENCOUNTER — Emergency Department
Admission: EM | Admit: 2016-02-05 | Discharge: 2016-02-05 | Disposition: A | Discharge status: Home / Self Care | Payer: Medicaid Other | Attending: Emergency Medicine | Admitting: Emergency Medicine

## 2016-02-05 ENCOUNTER — Emergency Department: Payer: Medicaid Other

## 2016-02-05 DIAGNOSIS — Y93H3 Activity, building and construction: Secondary | ICD-10-CM | POA: Insufficient documentation

## 2016-02-05 DIAGNOSIS — Y990 Civilian activity done for income or pay: Secondary | ICD-10-CM | POA: Insufficient documentation

## 2016-02-05 DIAGNOSIS — S63501A Unspecified sprain of right wrist, initial encounter: Secondary | ICD-10-CM | POA: Insufficient documentation

## 2016-02-05 DIAGNOSIS — F17210 Nicotine dependence, cigarettes, uncomplicated: Secondary | ICD-10-CM | POA: Insufficient documentation

## 2016-02-05 DIAGNOSIS — W010XXA Fall on same level from slipping, tripping and stumbling without subsequent striking against object, initial encounter: Secondary | ICD-10-CM | POA: Insufficient documentation

## 2016-02-05 DIAGNOSIS — Y9269 Other specified industrial and construction area as the place of occurrence of the external cause: Secondary | ICD-10-CM | POA: Insufficient documentation

## 2016-02-05 MED ORDER — MELOXICAM 15 MG PO TABS: 15.0000 mg | ORAL_TABLET | Freq: Every day | ORAL | 0 refills | Status: DC

## 2016-02-05 MED ORDER — HYDROCODONE-ACETAMINOPHEN 5-325 MG PO TABS: 1.0000 | ORAL_TABLET | Freq: Four times a day (QID) | ORAL | 0 refills | Status: AC | PRN

## 2016-02-05 NOTE — Discharge Instructions (Signed)
Please continue with splint until orthopedic follow-up. Call orthopedics tomorrow to schedule an appointment. Keep splint clean and dry. Take medications as needed for pain.

## 2016-02-05 NOTE — ED Provider Notes (Signed)
[facility]-EMERGENCY DEPARTMENT Provider Note   CSN: [id number] Arrival date & time: 02/05/16  [id number]     History   Chief Complaint Chief Complaint  Patient presents with  . Wrist Pain    HPI Willie [name] is a 45 y.o. male came to the ED with right wrist pain for 2 days. Pain is on the medial side of the right wrist and radiates to the lateral side with pain over the fourth finger MCP. 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  He put a splint on it yesterday and has taken Naproxen 200mg  BID with little relief.  Last night pt noticed some increased swelling and numbness and decided to come to the ED to get it looked at.  This morning he admits to weakness and limited ROM in his wrist when he tried to start his car.    HPI  Past Medical History:  Diagnosis Date  . Acute fulminating appendicitis with perforation and peritonitis 02/13/2013   Lap appy on [phone]  . GERD (gastroesophageal reflux disease)     Patient Active Problem List   Diagnosis Date Noted  . Postop check 03/03/2013  . Acute fulminating appendicitis with perforation and peritonitis 02/13/2013  . Left knee pain 01/22/2011    Past Surgical History:  Procedure Laterality Date  . APPENDECTOMY    . LAPAROSCOPIC APPENDECTOMY N/A 02/13/2013   Procedure: APPENDECTOMY LAPAROSCOPIC;  Surgeon: Cherylynn RidgesJames O Wyatt, MD;  Location: [facility] OR;  Service: General;  Laterality: N/A;       Home Medications    Prior to Admission medications   Medication Sig Start Date End Date Taking? Authorizing Provider  doxycycline (VIBRA-TABS) 100 MG tablet Take 1 tablet (100 mg total) by mouth 2 (two) times daily. 02/18/13   Emina Riebock, NP  esomeprazole (NEXIUM) 40 MG capsule Take 40 mg by mouth daily as needed (for heartburn).    Historical Provider, MD  HYDROcodone-acetaminophen (NORCO) 5-325 MG tablet Take 1 tablet by mouth every 6 (six) hours as  needed for moderate pain. 02/05/16   Evon Slackhomas C Rayola Everhart, PA-C  meloxicam (MOBIC) 15 MG tablet Take 1 tablet (15 mg total) by mouth daily. 02/05/16   Evon Slackhomas C Michiel Sivley, PA-C    Family History Family History  Problem Relation Age of Onset  . Hypertension Mother   . Hyperlipidemia Mother   . Hypertension Father   . Diabetes Father   . Sudden death Neg Hx   . Heart attack Neg Hx     Social History Social History  Substance Use Topics  . Smoking status: Current Every Day Smoker    Packs/day: 1.00    Types: Cigarettes  . Smokeless tobacco: Never Used  . Alcohol use 3.6 oz/week    6 Cans of beer per week     Comment: daily     Allergies   Penicillins   Review of Systems Review of Systems  Constitutional: Negative for chills and fever.  HENT: Negative for ear pain and sore throat.   Eyes: Negative for pain and visual disturbance.  Respiratory: Negative for cough and shortness of breath.   Cardiovascular: Negative for chest pain and palpitations.  Gastrointestinal: Negative for abdominal pain and vomiting.  Genitourinary: Negative for dysuria and hematuria.  Musculoskeletal: Negative for arthralgias and back pain.       Positive for right wrist pain.    Skin: Negative for color  change and rash.  Neurological: Positive for weakness (in the right wrist) and numbness (in the right wrist). Negative for dizziness, seizures and syncope.  All other systems reviewed and are negative.    Physical Exam Updated Vital Signs BP 139/79   Pulse 75   Temp 98 F (36.7 C) (Oral)   Resp 18   Ht 5\' 10"  (1.778 m)   Wt 81.6 kg   SpO2 99%   BMI 25.83 kg/m   Physical Exam  Constitutional: He is oriented to person, place, and time. He appears well-developed and well-nourished.  HENT:  Head: Normocephalic and atraumatic.  Neck: Neck supple.  Cardiovascular: Normal rate, regular rhythm, normal heart sounds and intact distal pulses.  Exam reveals no gallop and no friction rub.   No murmur  heard. Pulmonary/Chest: Effort normal and breath sounds normal. No respiratory distress. He has no wheezes.  Musculoskeletal: He exhibits edema and tenderness.  Limited AROM in right wrist and digits. PROM normal in right digits.  Increased swelling noted on exam in right wrist.  Muscle strength was 3/5 in right wrist flexion and extension. Normal AROM in elbow flexion and extension BIL. Muscle strength in elbow flexion and extension 5/5 BIL. Tender to palpation over fourth finger MCP.  Tender to palpation over distal radial head and distal ulnar.  Radial pulses 2+ BIL. Tender to palpation over scaphoid.   Neurological: He is alert and oriented to person, place, and time.  Skin: Skin is warm and dry. No rash noted.     ED Treatments / Results  Labs (all labs ordered are listed, but only abnormal results are displayed) Labs Reviewed - No data to display  EKG  EKG Interpretation None       Radiology Dg Wrist Complete Right  Result Date: 02/05/2016 CLINICAL DATA:  45 year old male fell 2 nights ago injuring wrist. Initial encounter. EXAM: RIGHT WRIST - COMPLETE 3+ VIEW COMPARISON:  01/19/2012. FINDINGS: No fracture or dislocation. No scaphoid fracture detected. If there were persistent scaphoid region tenderness, then followup plain film examination in 7-10 days or MR could be obtained to exclude occult scaphoid injury. IMPRESSION: No fracture or dislocation. Please see above. Electronically Signed   By: Lacy Duverney M.D.   On: 02/05/2016 07:21    Procedures Procedures (including critical care time) SPLINT APPLICATION Date/Time: 8:23 AM Authorized by: Patience Musca Consent: Verbal consent obtained. Risks and benefits: risks, benefits and alternatives were discussed Consent given by: patient Splint applied by: ED PA Location details: Right wrist  Splint type: Thumb spica  Supplies used: Ortho-Glass, prewrap, Ace wrap  Post-procedure: The splinted body part was  neurovascularly unchanged following the procedure. Patient tolerance: Patient tolerated the procedure well with no immediate complications.    Medications Ordered in ED Medications - No data to display   Initial Impression / Assessment and Plan / ED Course  I have reviewed the triage vital signs and the nursing notes.  Pertinent labs & imaging results that were available during my care of the patient were reviewed by me and considered in my medical decision making (see chart for details).  Clinical Course    45 year old male with injury to the right wrist 2 days ago at work. X-ray showed no evidence of acute bony abnormality. There is possible concern for scaphoid fracture as well as scapholunate ligament injury. He is placed into a thumb spica splint. He will follow-up with orthopedics. He is given meloxicam 15 mg daily and Norco as needed for severe  pain.  Final Clinical Impressions(s) / ED Diagnoses   Final diagnoses:  Sprain of right wrist, initial encounter    New Prescriptions Discharge Medication List as of 02/05/2016  8:13 AM    START taking these medications   Details  HYDROcodone-acetaminophen (NORCO) 5-325 MG tablet Take 1 tablet by mouth every 6 (six) hours as needed for moderate pain., Starting Sun 02/05/2016, Print    meloxicam (MOBIC) 15 MG tablet Take 1 tablet (15 mg total) by mouth daily., Starting Sun 02/05/2016, Print         Evon Slack, PA-C 02/05/16 1610    Myrna Blazer, MD 02/05/16 [phone]

## 2016-02-05 NOTE — ED Triage Notes (Signed)
Patient reports he tripped and fell on Friday.  Patient with right hand/wrist pain since.  Reports now with some swelling.

## 2017-04-01 IMAGING — CR DG WRIST COMPLETE 3+V*R*
1 series · 4 of 4 positions shown · non-contrast
Comparison: 01/19/2012.

CLINICAL DATA: 45-year-old male fell 2 nights ago injuring wrist.
Initial encounter.

EXAM:
RIGHT WRIST - COMPLETE 3+ VIEW

[Series 1: dg wrist complete right · 0.14mm/px · 4 of 4 slices shown]
[im 1/4]
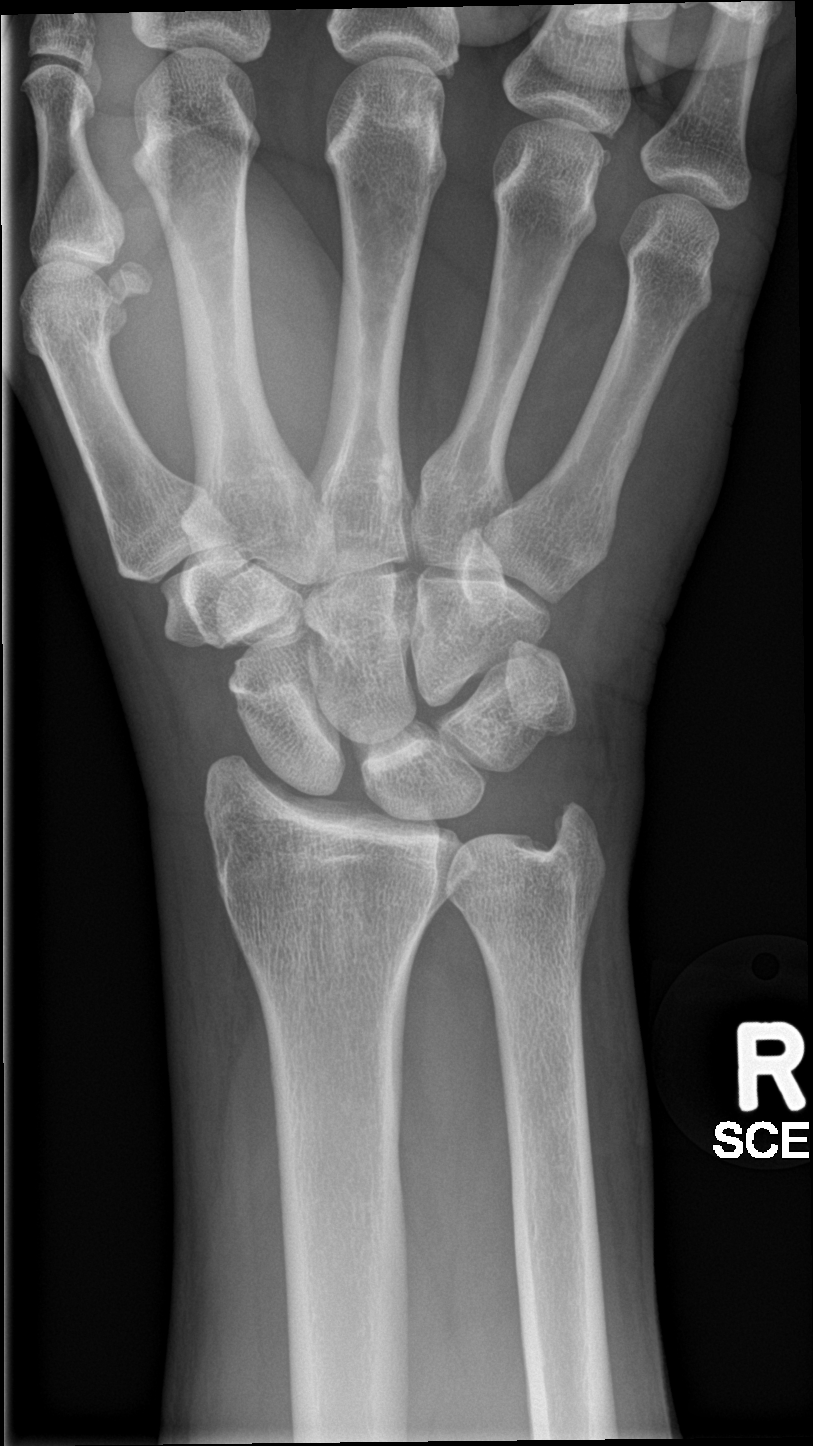
[im 2/4]
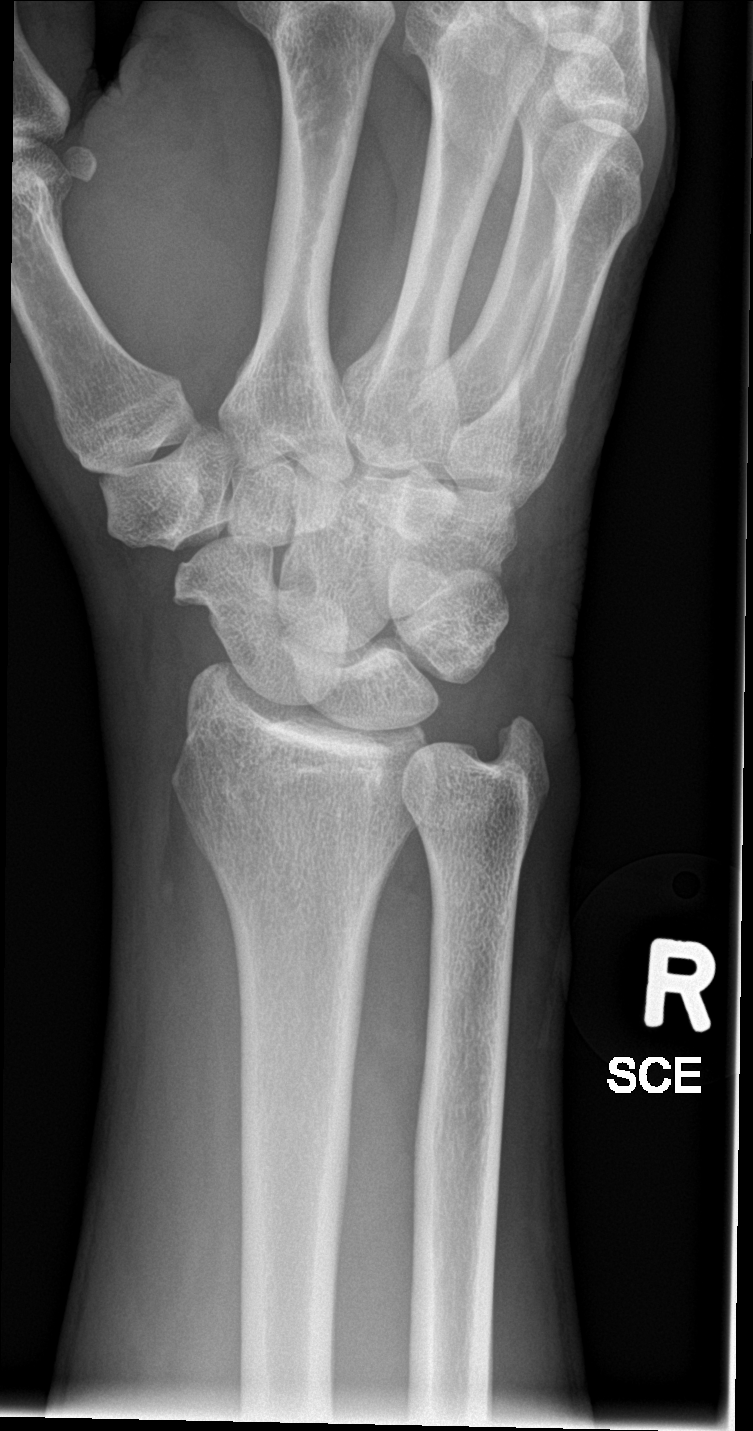
[im 3/4]
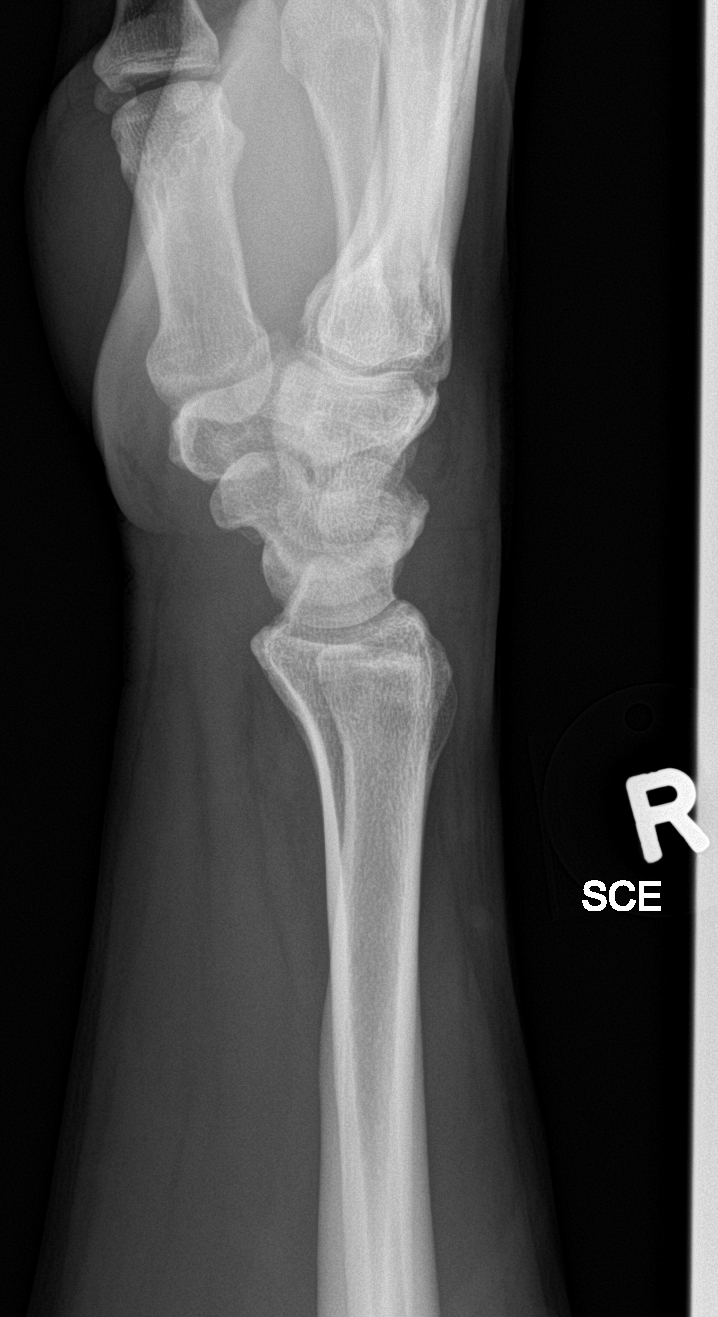
[im 4/4]
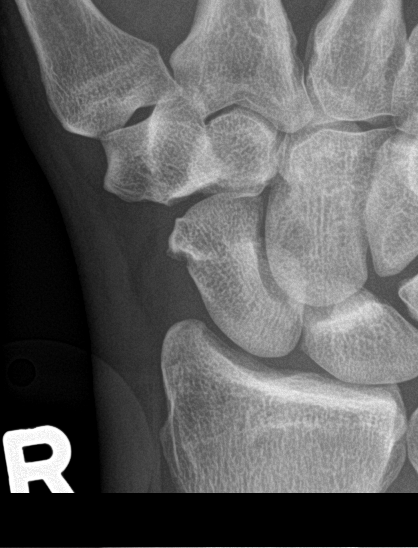

[4 of 4 positions shown; findings below may reference images not displayed]

FINDINGS: No fracture or dislocation.

No scaphoid fracture detected. If there were persistent scaphoid
region tenderness, then followup plain film examination in 7-10 days
or MR could be obtained to exclude occult scaphoid injury.
IMPRESSION: No fracture or dislocation.

Please see above.

## 2019-04-07 ENCOUNTER — Ambulatory Visit: Payer: Medicaid Other | Attending: Internal Medicine

## 2019-04-07 DIAGNOSIS — Z20822 Contact with and (suspected) exposure to covid-19: Secondary | ICD-10-CM

## 2019-04-09 LAB — NOVEL CORONAVIRUS, NAA: SARS-CoV-2, NAA: NOT DETECTED

## 2021-07-18 DIAGNOSIS — M4317 Spondylolisthesis, lumbosacral region: Secondary | ICD-10-CM | POA: Insufficient documentation

## 2021-07-18 DIAGNOSIS — M4726 Other spondylosis with radiculopathy, lumbar region: Secondary | ICD-10-CM | POA: Insufficient documentation

## 2021-07-18 DIAGNOSIS — M21372 Foot drop, left foot: Secondary | ICD-10-CM | POA: Insufficient documentation

## 2022-02-08 DIAGNOSIS — M5417 Radiculopathy, lumbosacral region: Secondary | ICD-10-CM | POA: Insufficient documentation

## 2022-04-12 ENCOUNTER — Ambulatory Visit: Payer: Medicaid Other | Admitting: Nurse Practitioner

## 2022-04-12 ENCOUNTER — Encounter: Payer: Self-pay | Admitting: Nurse Practitioner

## 2022-04-12 VITALS — BP 124/74 | HR 89 | Temp 97.9°F | Resp 16 | Ht 70.0 in | Wt 188.1 lb

## 2022-04-12 DIAGNOSIS — Z0000 Encounter for general adult medical examination without abnormal findings: Secondary | ICD-10-CM | POA: Insufficient documentation

## 2022-04-12 DIAGNOSIS — Z125 Encounter for screening for malignant neoplasm of prostate: Secondary | ICD-10-CM | POA: Diagnosis not present

## 2022-04-12 DIAGNOSIS — E663 Overweight: Secondary | ICD-10-CM

## 2022-04-12 DIAGNOSIS — Z1211 Encounter for screening for malignant neoplasm of colon: Secondary | ICD-10-CM

## 2022-04-12 DIAGNOSIS — Z8739 Personal history of other diseases of the musculoskeletal system and connective tissue: Secondary | ICD-10-CM | POA: Insufficient documentation

## 2022-04-12 DIAGNOSIS — Z720 Tobacco use: Secondary | ICD-10-CM

## 2022-04-12 DIAGNOSIS — Z01818 Encounter for other preprocedural examination: Secondary | ICD-10-CM | POA: Insufficient documentation

## 2022-04-12 NOTE — Assessment & Plan Note (Signed)
Patient is interested in stop smoking especially for surgery.  He is interested in Chantix.  Pending labs

## 2022-04-12 NOTE — Assessment & Plan Note (Signed)
Pending labs

## 2022-04-12 NOTE — Assessment & Plan Note (Signed)
Pending labs and may have a procedure.  Gave information for orthopedist to send clearance form to [place].

## 2022-04-12 NOTE — Patient Instructions (Signed)
Nice to see you today I will be in touch with the labs once I have them Go get the chest xray when you can over the next week or so I want to see you in 1 year for your next physical

## 2022-04-12 NOTE — Assessment & Plan Note (Signed)
Pending uric acid.  Patient states that he has been told there are medications to take to prevent gout

## 2022-04-12 NOTE — Assessment & Plan Note (Signed)
Discussed age-appropriate immunizations and screening exams.  Ambulatory referral placed for colonoscopy.  PSA drawn today.  Patient can get vaccines at local pharmacy.  Information given at discharge in regards to patient age and preventative healthcare maintenance with anticipatory guidance.

## 2022-04-12 NOTE — Progress Notes (Signed)
New Patient Office Visit  Subjective    Patient ID: Willie [name], male    DOB: 07/11/1970  Age: 51 y.o. MRN: [id number]  CC:  Chief Complaint  Patient presents with   Establish Care    HPI Willie [name] presents to establish care  Back surgery: States that he is followed by [facility], has been seeing needs surgery  State that he has done well with anesthesia in the past.  No family history of malignant hyperthermia.  Tobacco use: states that he has tried in the past. States that he has been cutting back on the amount. Willing to try chantix  for complete physical and follow up of chronic conditions.  Immunizations: -Tetanus: due 2014 -Influenza: refused -Shingles: Discussed with patient -Pneumonia: Too young Covid: refused  -HPV: Aged out  Diet: Fair diet. States that he will do 1 meal a day at night time. He will drink coffee and sometimes he have lunch. Some water coffee.  Exercise: No regular exercise. Employment   Eye exam: [employer] Dental exam: Needs updating    Colonoscopy: Ambulatory referral for [facility]  Lung Cancer Screening: N/A Dexa: N/A  PSA: Due  Sleep: states that he goes to bed around 11 and will wake up every hour with Charlie horse. States that he has done magnesium that does not help. 6. Will feel rested. Does not snore  Back injury: states that he broke his back in December 2021. States that he was seen 3-4 months after the injury. Has pain the back and down the left leg     Outpatient Encounter Medications as of 04/12/2022  Medication Sig   HYDROcodone-acetaminophen (NORCO) 5-325 MG tablet Take 1 tablet by mouth every 6 (six) hours as needed for moderate pain.   meloxicam (MOBIC) 15 MG tablet Take 1 tablet (15 mg total) by mouth daily.   doxycycline (VIBRA-TABS) 100 MG tablet Take 1 tablet (100 mg total) by mouth 2 (two) times daily. (Patient not taking: Reported on 04/12/2022)   esomeprazole (NEXIUM) 40 MG capsule Take  40 mg by mouth daily as needed (for heartburn). (Patient not taking: Reported on 04/12/2022)   No facility-administered encounter medications on file as of 04/12/2022.    Past Medical History:  Diagnosis Date   Acute fulminating appendicitis with perforation and peritonitis 02/13/2013   Lap appy on 103114   GERD (gastroesophageal reflux disease)     Past Surgical History:  Procedure Laterality Date   APPENDECTOMY     LAPAROSCOPIC APPENDECTOMY N/A 02/13/2013   Procedure: APPENDECTOMY LAPAROSCOPIC;  Surgeon: Cherylynn Ridges, MD;  Location: [facility] OR;  Service: General;  Laterality: N/A;    Family History  Problem Relation Age of Onset   Hypertension Mother    Hyperlipidemia Mother    Sudden death Neg Hx    Heart attack Neg Hx     Social History   Socioeconomic History   Marital status: Single    Spouse name: Not on file   Number of children: 7   Years of education: Not on file   Highest education level: Not on file  Occupational History   Not on file  Tobacco Use   Smoking status: Every Day    Packs/day: 1.00    Years: 25.00    Total pack years: 25.00    Types: Cigarettes   Smokeless tobacco: Never  Vaping Use   Vaping Use: Never used  Substance and Sexual Activity   Alcohol use: Yes  Alcohol/week: 6.0 standard drinks of alcohol    Types: 6 Cans of beer per week    Comment: daily   Drug use: No   Sexual activity: Not Currently    Birth control/protection: None  Other Topics Concern   Not on file  Social History Narrative   7 kids   6,9,17      Fulltime: Haematologist for [employer]   Social Determinants of Health   Financial Resource Strain: Not on file  Food Insecurity: Not on file  Transportation Needs: Not on file  Physical Activity: Not on file  Stress: Not on file  Social Connections: Not on file  Intimate Partner Violence: Not on file    Review of Systems  Constitutional:  Negative for chills and fever.  Respiratory:  Negative for shortness of  breath.   Cardiovascular:  Negative for chest pain.  Gastrointestinal:  Negative for abdominal pain, blood in stool, constipation, diarrhea, nausea and vomiting.       BM daily   Genitourinary:  Negative for dysuria and hematuria.  Neurological:  Positive for tingling (left foot). Negative for headaches.  Psychiatric/Behavioral:  Negative for hallucinations and suicidal ideas.         Objective    BP 124/74   Pulse 89   Temp 97.9 F (36.6 C)   Resp 16   Ht 5\' 10"  (1.778 m)   Wt 188 lb 2 oz (85.3 kg)   SpO2 98%   BMI 26.99 kg/m   Physical Exam Constitutional:      Appearance: Normal appearance.  HENT:     Right Ear: Tympanic membrane, ear canal and external ear normal.     Left Ear: Tympanic membrane, ear canal and external ear normal.     Mouth/Throat:     Mouth: Mucous membranes are moist.     Pharynx: Oropharynx is clear.  Eyes:     Extraocular Movements: Extraocular movements intact.     Pupils: Pupils are equal, round, and reactive to light.  Cardiovascular:     Rate and Rhythm: Normal rate and regular rhythm.     Heart sounds: Normal heart sounds.  Pulmonary:     Breath sounds: Normal breath sounds.  Abdominal:     General: Bowel sounds are normal. There is no distension.     Palpations: There is no mass.     Tenderness: There is no abdominal tenderness.     Hernia: No hernia is present.  Genitourinary:    Comments: Deferred by patient  Musculoskeletal:     Right lower leg: No edema.     Left lower leg: No edema.  Lymphadenopathy:     Cervical: No cervical adenopathy.  Skin:    General: Skin is warm.  Neurological:     General: No focal deficit present.     Mental Status: He is alert.     Deep Tendon Reflexes:     Reflex Scores:      Bicep reflexes are 2+ on the right side and 2+ on the left side.      Patellar reflexes are 2+ on the right side and 2+ on the left side.    Comments: Bilateral upper strength 5/5  RLE 4/5 with flexion   Psychiatric:         Mood and Affect: Mood normal.        Behavior: Behavior normal.        Thought Content: Thought content normal.        Judgment: Judgment  normal.         Assessment & Plan:   Problem List Items Addressed This Visit       Other   Preventative health care - Primary    Discussed age-appropriate immunizations and screening exams.  Ambulatory referral placed for colonoscopy.  PSA drawn today.  Patient can get vaccines at local pharmacy.  Information given at discharge in regards to patient age and preventative healthcare maintenance with anticipatory guidance.      Relevant Orders   CBC   Lipid panel   Comprehensive metabolic panel   Hemoglobin A1c   TSH   History of gout    Pending uric acid.  Patient states that he has been told there are medications to take to prevent gout      Relevant Orders   Uric acid   Tobacco use    Patient is interested in stop smoking especially for surgery.  He is interested in Chantix.  Pending labs      Relevant Orders   DG Chest 2 View   Overweight    Pending labs.      Relevant Orders   Lipid panel   Hemoglobin A1c   Pre-op evaluation    Pending labs and may have a procedure.  Gave information for orthopedist to send clearance form to [place].      Relevant Orders   DG Chest 2 View   Other Visit Diagnoses     Screening for prostate cancer       Relevant Orders   PSA   Screening for colon cancer       Relevant Orders   Ambulatory referral to Gastroenterology       Return in about 1 year (around 04/13/2023) for CPE and labs.   Audria Nine, NP

## 2022-04-13 ENCOUNTER — Ambulatory Visit
Admission: RE | Admit: 2022-04-13 | Discharge: 2022-04-13 | Disposition: A | Discharge status: Home / Self Care | Payer: Medicaid Other | Source: Ambulatory Visit | Attending: Nurse Practitioner | Admitting: Nurse Practitioner

## 2022-04-13 ENCOUNTER — Ambulatory Visit
Admission: RE | Admit: 2022-04-13 | Discharge: 2022-04-13 | Disposition: A | Discharge status: Home / Self Care | Payer: Medicaid Other | Attending: Nurse Practitioner | Admitting: Nurse Practitioner

## 2022-04-13 DIAGNOSIS — Z01818 Encounter for other preprocedural examination: Secondary | ICD-10-CM | POA: Diagnosis present

## 2022-04-13 DIAGNOSIS — Z720 Tobacco use: Secondary | ICD-10-CM

## 2022-04-13 LAB — COMPREHENSIVE METABOLIC PANEL
ALT: 28 U/L (ref 0–53)
AST: 21 U/L (ref 0–37)
Albumin: 4.4 g/dL (ref 3.5–5.2)
Alkaline Phosphatase: 55 U/L (ref 39–117)
BUN: 14 mg/dL (ref 6–23)
CO2: 25 mEq/L (ref 19–32)
Calcium: 9.4 mg/dL (ref 8.4–10.5)
Chloride: 104 mEq/L (ref 96–112)
Creatinine, Ser: 1.15 mg/dL (ref 0.40–1.50)
GFR: 72.69 mL/min (ref >60.00)
Glucose, Bld: 97 mg/dL (ref 70–99)
Potassium: 4.6 mEq/L (ref 3.5–5.1)
Sodium: 139 mEq/L (ref 135–145)
Total Bilirubin: 0.6 mg/dL (ref 0.2–1.2)
Total Protein: 7 g/dL (ref 6.0–8.3)

## 2022-04-13 LAB — CBC
HCT: 49.5 % (ref 39.0–52.0)
Hemoglobin: 16.9 g/dL (ref 13.0–17.0)
MCHC: 34.1 g/dL (ref 30.0–36.0)
MCV: 96.7 fl (ref 78.0–100.0)
Platelets: 241 10*3/uL (ref 150.0–400.0)
RBC: 5.12 Mil/uL (ref 4.22–5.81)
RDW: 13.5 % (ref 11.5–15.5)
WBC: 10.1 10*3/uL (ref 4.0–10.5)

## 2022-04-13 LAB — LIPID PANEL
Cholesterol: 159 mg/dL (ref 0–200)
HDL: 80.2 mg/dL (ref >39.00)
LDL Cholesterol: 72 mg/dL (ref 0–99)
NonHDL: 78.54
Total CHOL/HDL Ratio: 2
Triglycerides: 35 mg/dL (ref 0.0–149.0)
VLDL: 7 mg/dL (ref 0.0–40.0)

## 2022-04-13 LAB — TSH: TSH: 1 u[IU]/mL (ref 0.35–5.50)

## 2022-04-13 LAB — URIC ACID: Uric Acid, Serum: 8.8 mg/dL — ABNORMAL HIGH (ref 4.0–7.8)

## 2022-04-13 LAB — PSA: PSA: 0.28 ng/mL (ref 0.10–4.00)

## 2022-04-13 LAB — HEMOGLOBIN A1C: Hgb A1c MFr Bld: 5.6 % (ref 4.6–6.5)

## 2022-04-17 ENCOUNTER — Telehealth: Payer: Self-pay | Admitting: Nurse Practitioner

## 2022-04-17 ENCOUNTER — Other Ambulatory Visit: Payer: Self-pay | Admitting: Nurse Practitioner

## 2022-04-17 DIAGNOSIS — E790 Hyperuricemia without signs of inflammatory arthritis and tophaceous disease: Secondary | ICD-10-CM

## 2022-04-17 DIAGNOSIS — Z720 Tobacco use: Secondary | ICD-10-CM

## 2022-04-17 MED ORDER — ALLOPURINOL 100 MG PO TABS: 100.0000 mg | ORAL_TABLET | Freq: Every day | ORAL | 1 refills | Status: DC

## 2022-04-17 MED ORDER — VARENICLINE TARTRATE (STARTER) 0.5 MG X 11 & 1 MG X 42 PO TBPK: 1.0000 | ORAL_TABLET | ORAL | 0 refills | Status: DC

## 2022-04-17 NOTE — Telephone Encounter (Signed)
Pt call in requesting RX allopurinol (ZYLOPRIM) 100 MG tablet  and Varenicline Tartrate, Starter, (CHANTIX STARTING MONTH PAK) 0.5 MG X 11 & 1 MG X 42 TBPK  be sent to [id number] - [place], [place] - [address]  . Please advise [phone]

## 2022-04-17 NOTE — Telephone Encounter (Signed)
Chantix starter pack sent into pharmacy

## 2022-04-17 NOTE — Telephone Encounter (Signed)
Spoke with patient and advised. Also recommended he try GoodRx if his insurance does not cover. Nothing further needed at this time.

## 2022-04-17 NOTE — Telephone Encounter (Signed)
-----   Message from [place], [place] sent at 04/17/2022  9:08 AM EST ----- Spoke with patient regarding results and recommendations. Patient voiced understanding.   Patient agrees to start Allopurinol, aware of need for labs in 73month and will call to schedule.   Patient also asks about starting Chantix. States he needs to have surgery and has been advised he will need to stop smoking prior to.  Please advise, thanks!

## 2022-04-17 NOTE — Telephone Encounter (Signed)
This has been completed. Nothing further needed at this time.

## 2022-04-27 ENCOUNTER — Telehealth: Payer: Self-pay | Admitting: Nurse Practitioner

## 2022-04-27 NOTE — Telephone Encounter (Signed)
Got notice of his lumbar surgery and they need an EKG for clearance he will need an appointment with me please

## 2022-04-27 NOTE — Telephone Encounter (Signed)
Spoke to pt, scheduled ov for 05/03/22

## 2022-05-03 ENCOUNTER — Encounter: Payer: Self-pay | Admitting: Nurse Practitioner

## 2022-05-03 ENCOUNTER — Ambulatory Visit: Payer: Medicaid Other | Admitting: Nurse Practitioner

## 2022-05-03 VITALS — BP 120/58 | HR 102 | Ht 70.0 in | Wt 188.0 lb

## 2022-05-03 DIAGNOSIS — E790 Hyperuricemia without signs of inflammatory arthritis and tophaceous disease: Secondary | ICD-10-CM

## 2022-05-03 DIAGNOSIS — Z01818 Encounter for other preprocedural examination: Secondary | ICD-10-CM | POA: Diagnosis not present

## 2022-05-03 MED ORDER — VARENICLINE TARTRATE (STARTER) 0.5 MG X 11 & 1 MG X 42 PO TBPK: 1.0000 | ORAL_TABLET | ORAL | 0 refills | Status: DC

## 2022-05-03 MED ORDER — ALLOPURINOL 100 MG PO TABS: 100.0000 mg | ORAL_TABLET | Freq: Every day | ORAL | 1 refills | Status: DC

## 2022-05-03 NOTE — Assessment & Plan Note (Signed)
Patient will be under going a laminectomy.  Patient is below average risk according to ACS risk calculator.  EKG within normal limits sans slightly tachycardic at 102.  Patient's blood work was within normal limits liver kidney electrolytes okay red-white blood cell okay patient is not diabetic.  Chest x-ray also within normal limits.  Will sign clearance form for patient and faxed to orthopedist.

## 2022-05-03 NOTE — Patient Instructions (Addendum)
Nice to see you today I will clear you for surgery and we will fax the forms to your surgeons office Let me know if you need anything else

## 2022-05-03 NOTE — Progress Notes (Signed)
   Established Patient Office Visit  Subjective   Patient ID: Willie [name], male    DOB: 04-Jul-1970  Age: 52 y.o. MRN: [id number]  Chief Complaint  Patient presents with   Pre-op Exam    HPI  Pre-op: Patient was seen by me on 04/12/2022. Is followed by orthro and going to have a back surgery. Surgeon is going to be Dr Meda Coffee. They are going to do a L5-S1 laminectomy, partial facetectomy, foraminotomy. Patient had labs drawn on 04/12/2022 that were WNL and also had a chest xray because of his smoking, which came back a no acute findings.  No trouble with anseathia. States that no malignant hyperthermia in the family or personally. States that he had appendix removed approx 10 years ago and did well.  We have discussed that smoking would delay wound healing.  Patient was started on Chantix was unable to pick it up as it was at the pharmacy that he did not use.  Recent today.  EKG done in office along with ACS surgical calculator risk    Review of Systems  Constitutional:  Negative for chills and fever.  Respiratory:  Negative for shortness of breath.   Cardiovascular:  Negative for chest pain.  Neurological:  Negative for headaches.  Psychiatric/Behavioral:  Negative for hallucinations and suicidal ideas.       Objective:     BP (!) 120/58   Pulse (!) 102   Ht 5\' 10"  (1.778 m)   Wt 188 lb (85.3 kg)   SpO2 97%   BMI 26.98 kg/m    Physical Exam Vitals and nursing note reviewed.  Constitutional:      Appearance: Normal appearance.  Cardiovascular:     Rate and Rhythm: Normal rate and regular rhythm.     Heart sounds: Normal heart sounds.  Pulmonary:     Effort: Pulmonary effort is normal.     Breath sounds: Normal breath sounds.  Neurological:     Mental Status: He is alert.      No results found for any visits on 05/03/22.    The 10-year ASCVD risk score (Arnett DK, et al., 2019) is: 4%    Assessment & Plan:   Problem List Items Addressed This  Visit       Other   Pre-op evaluation - Primary    Patient will be under going a laminectomy.  Patient is below average risk according to ACS risk calculator.  EKG within normal limits sans slightly tachycardic at 102.  Patient's blood work was within normal limits liver kidney electrolytes okay red-white blood cell okay patient is not diabetic.  Chest x-ray also within normal limits.  Will sign clearance form for patient and faxed to orthopedist.      Relevant Orders   EKG 12-Lead (Completed)   EKG 12-Lead (Completed)   Other Visit Diagnoses     Elevated uric acid in blood       Relevant Medications   allopurinol (ZYLOPRIM) 100 MG tablet       Return if symptoms worsen or fail to improve.    Romilda Garret, NP

## 2022-05-04 NOTE — Telephone Encounter (Signed)
Surgical clearance forms from [place] completed and faxed to their office. Confirmation received.

## 2022-08-02 ENCOUNTER — Telehealth: Payer: Self-pay | Admitting: Nurse Practitioner

## 2022-08-02 NOTE — Telephone Encounter (Signed)
Patient scheduled.He said that he has been so busy with PT he forgot.

## 2022-08-02 NOTE — Telephone Encounter (Signed)
Left message to return call to our office.  

## 2022-08-02 NOTE — Telephone Encounter (Signed)
Patient never got uric acid and liver rechecked after starting allopurinol. Can we reach out and get the labs scheduled

## 2022-08-03 ENCOUNTER — Other Ambulatory Visit: Payer: Self-pay | Admitting: Nurse Practitioner

## 2022-08-03 DIAGNOSIS — E790 Hyperuricemia without signs of inflammatory arthritis and tophaceous disease: Secondary | ICD-10-CM

## 2022-08-03 DIAGNOSIS — Z8739 Personal history of other diseases of the musculoskeletal system and connective tissue: Secondary | ICD-10-CM

## 2022-08-21 ENCOUNTER — Other Ambulatory Visit (INDEPENDENT_AMBULATORY_CARE_PROVIDER_SITE_OTHER): Payer: Medicaid Other

## 2022-08-21 DIAGNOSIS — Z8739 Personal history of other diseases of the musculoskeletal system and connective tissue: Secondary | ICD-10-CM

## 2022-08-21 DIAGNOSIS — E790 Hyperuricemia without signs of inflammatory arthritis and tophaceous disease: Secondary | ICD-10-CM

## 2022-08-22 LAB — COMPREHENSIVE METABOLIC PANEL
ALT: 36 U/L (ref 0–53)
AST: 44 U/L — ABNORMAL HIGH (ref 0–37)
Albumin: 4 g/dL (ref 3.5–5.2)
Alkaline Phosphatase: 75 U/L (ref 39–117)
BUN: 12 mg/dL (ref 6–23)
CO2: 26 mEq/L (ref 19–32)
Calcium: 9.3 mg/dL (ref 8.4–10.5)
Chloride: 102 mEq/L (ref 96–112)
Creatinine, Ser: 1.07 mg/dL (ref 0.40–1.50)
GFR: 79.06 mL/min (ref >60.00)
Glucose, Bld: 127 mg/dL — ABNORMAL HIGH (ref 70–99)
Potassium: 4.7 mEq/L (ref 3.5–5.1)
Sodium: 137 mEq/L (ref 135–145)
Total Bilirubin: 0.7 mg/dL (ref 0.2–1.2)
Total Protein: 7.2 g/dL (ref 6.0–8.3)

## 2022-08-22 LAB — URIC ACID: Uric Acid, Serum: 6.3 mg/dL (ref 4.0–7.8)

## 2022-08-23 ENCOUNTER — Other Ambulatory Visit: Payer: Self-pay | Admitting: Nurse Practitioner

## 2022-08-23 DIAGNOSIS — E790 Hyperuricemia without signs of inflammatory arthritis and tophaceous disease: Secondary | ICD-10-CM

## 2023-01-07 ENCOUNTER — Encounter: Payer: Self-pay | Admitting: Nurse Practitioner

## 2023-01-07 ENCOUNTER — Ambulatory Visit: Payer: Medicaid Other | Admitting: Nurse Practitioner

## 2023-01-07 VITALS — BP 142/82 | HR 107 | Temp 98.0°F | Ht 70.0 in | Wt 187.0 lb

## 2023-01-07 DIAGNOSIS — R519 Headache, unspecified: Secondary | ICD-10-CM | POA: Insufficient documentation

## 2023-01-07 DIAGNOSIS — L989 Disorder of the skin and subcutaneous tissue, unspecified: Secondary | ICD-10-CM

## 2023-01-07 DIAGNOSIS — M961 Postlaminectomy syndrome, not elsewhere classified: Secondary | ICD-10-CM | POA: Diagnosis not present

## 2023-01-07 NOTE — Assessment & Plan Note (Signed)
History of same has been followed by [facility] orthopedics to perform the surgery.  Patient states they had mentioned doing a refer him to pain management never did.  Patient request pain management referral ambulatory referral to pain management placed today

## 2023-01-07 NOTE — Assessment & Plan Note (Signed)
Difficulties or actinic keratosis.  Will refer to dermatology for further evaluation.  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

## 2023-01-07 NOTE — Patient Instructions (Signed)
Nice to see you today I will be in touch with the labs once I have reviewed them Follow up in approx 3.5 months for your physical

## 2023-01-07 NOTE — Assessment & Plan Note (Signed)
Not typical migraine symptoms.  Query cluster migraines.  Will check CRP and sed rate to rule out temporal arteritis.  Neurological exam benign in office does have resolution with ibuprofen use

## 2023-01-07 NOTE — Progress Notes (Signed)
Established Patient Office Visit  Subjective   Patient ID: Willie [name], male    DOB: [birth year]  Age: 52 y.o. MRN: [id number]  Chief Complaint  Patient presents with   spots on face    Pt states a black spot on left side face a few years ago. Pt complains of seeing same black spot appear on other side of face (right). Pt states they feel like scabs. Pt complains of slight "pressure" pain when touched. Looks scabby but does not scratch.    Medication Problem    Pt would like to discuss referral to pain management clinic. States there has been issues with continuation of the same pain medication.     HPI   Lesion on face: state that he has had a spot on the left side for 2 years. States that he had a spot come up on the right side. States it has been. States that there is no discharge or itching. Sttes that he has shaved over the left one and it has not bleed. State she is outside a lot in the sun and ndoes not wear a cap or sunscreen. States that he has been having headaches for the past couple of months. State that they do happen on bilateral temples. State that the left eye will go blurry. States that he is having it 3 three days a week and last for approx 0.5 hours. States that he has been taking the 800mg  motrin. That it does help. 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 Patient has been diagnosed with post laminectomy syndrome. Pateitn does have a history of L5-S1 laminectomy, partial facetectomy, foraminotomy. States that he has had 2 injections prior ot o surgery but none after. States that he did PT after the surgery.    Review of Systems  Constitutional:  Negative for chills and fever.  Respiratory:  Negative for shortness of breath.   Cardiovascular:  Negative for chest pain.  Musculoskeletal:  Positive for back pain.  Neurological:  Positive for tingling (baseline) and  headaches.  Psychiatric/Behavioral:  Negative for hallucinations and suicidal ideas.       Objective:     BP (!) 142/82   Pulse (!) 107   Temp 98 F (36.7 C) (Temporal)   Ht 5\' 10"  (1.778 m)   Wt 187 lb (84.8 kg)   SpO2 97%   BMI 26.83 kg/m    Physical Exam Vitals and nursing note reviewed.  Constitutional:      Appearance: Normal appearance.  HENT:     Mouth/Throat:     Mouth: Mucous membranes are moist.     Pharynx: Oropharynx is clear.  Eyes:     Extraocular Movements: Extraocular movements intact.     Pupils: Pupils are equal, round, and reactive to light.  Cardiovascular:     Rate and Rhythm: Regular rhythm. Tachycardia present.     Heart sounds: Normal heart sounds.  Pulmonary:     Effort: Pulmonary effort is normal.     Breath sounds: Normal breath sounds.  Musculoskeletal:     Lumbar back: Negative right straight leg raise test and negative left straight leg raise test.  Skin:         Comments: Bilateral Templar skin lesions   Neurological:     General: No focal deficit present.     Mental Status: He is alert.     Cranial  Nerves: Cranial nerves 2-12 are intact.     Motor: Motor function is intact.     Deep Tendon Reflexes:     Reflex Scores:      Patellar reflexes are 2+ on the right side and 2+ on the left side.    Comments: Bilateral upper and lower extremity strength 5/5      No results found for any visits on 01/07/23.    The 10-year ASCVD risk score (Arnett DK, et al., 2019) is: 5.8%    Assessment & Plan:   Problem List Items Addressed This Visit       Musculoskeletal and Integument   Skin lesions    Difficulties or actinic keratosis.  Will refer to dermatology for further evaluation.  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  Patient states they had mentioned doing a refer him to pain management never did.  Patient request pain management referral ambulatory referral to pain management placed today      Relevant Orders   Ambulatory referral to Pain Clinic   Bilateral headaches    Not typical migraine symptoms.  Query cluster migraines.  Will check CRP and sed rate to rule out temporal arteritis.  Neurological exam benign in office does have resolution with ibuprofen use      Relevant Medications   ibuprofen (ADVIL) 800 MG tablet   Other Relevant Orders   CBC   Comprehensive metabolic panel   High sensitivity CRP   Sedimentation rate   TSH    Return in about 3 months (around 04/14/2023) for CPE and Labs.    Audria Nine, NP

## 2023-01-08 ENCOUNTER — Encounter: Payer: Self-pay | Admitting: *Deleted

## 2023-01-08 LAB — COMPREHENSIVE METABOLIC PANEL
ALT: 36 U/L (ref 0–53)
AST: 29 U/L (ref 0–37)
Albumin: 4.3 g/dL (ref 3.5–5.2)
Alkaline Phosphatase: 66 U/L (ref 39–117)
BUN: 12 mg/dL (ref 6–23)
CO2: 29 mEq/L (ref 19–32)
Calcium: 9.7 mg/dL (ref 8.4–10.5)
Chloride: 102 mEq/L (ref 96–112)
Creatinine, Ser: 1.28 mg/dL (ref 0.40–1.50)
GFR: 63.59 mL/min (ref >60.00)
Glucose, Bld: 142 mg/dL — ABNORMAL HIGH (ref 70–99)
Potassium: 4.3 mEq/L (ref 3.5–5.1)
Sodium: 141 mEq/L (ref 135–145)
Total Bilirubin: 0.7 mg/dL (ref 0.2–1.2)
Total Protein: 6.7 g/dL (ref 6.0–8.3)

## 2023-01-08 LAB — CBC
HCT: 49.2 % (ref 39.0–52.0)
Hemoglobin: 16.3 g/dL (ref 13.0–17.0)
MCHC: 33.2 g/dL (ref 30.0–36.0)
MCV: 99.8 fl (ref 78.0–100.0)
Platelets: 237 10*3/uL (ref 150.0–400.0)
RBC: 4.92 Mil/uL (ref 4.22–5.81)
RDW: 13 % (ref 11.5–15.5)
WBC: 10.2 10*3/uL (ref 4.0–10.5)

## 2023-01-08 LAB — HIGH SENSITIVITY CRP: CRP, High Sensitivity: 1.24 mg/L (ref 0.000–5.000)

## 2023-01-08 LAB — TSH: TSH: 1.36 u[IU]/mL (ref 0.35–5.50)

## 2023-01-08 LAB — SEDIMENTATION RATE: Sed Rate: 1 mm/hr (ref 0–20)

## 2023-01-17 ENCOUNTER — Other Ambulatory Visit: Payer: Self-pay | Admitting: Nurse Practitioner

## 2023-01-17 DIAGNOSIS — E790 Hyperuricemia without signs of inflammatory arthritis and tophaceous disease: Secondary | ICD-10-CM

## 2023-01-23 ENCOUNTER — Telehealth: Payer: Self-pay | Admitting: Nurse Practitioner

## 2023-01-23 NOTE — Telephone Encounter (Signed)
Got noticed from [facility] dermatology that he nees a completed medicaid form to process the referral

## 2023-01-24 NOTE — Telephone Encounter (Signed)
Contacted pt to let him know that he needs a completed medicaid form in order for his referral process to be completed.  Pt understood and verbalized understanding. Advised pt to give our office a call once medicaid form has been completed.

## 2023-02-07 DIAGNOSIS — M899 Disorder of bone, unspecified: Secondary | ICD-10-CM | POA: Insufficient documentation

## 2023-02-07 DIAGNOSIS — Z79899 Other long term (current) drug therapy: Secondary | ICD-10-CM | POA: Insufficient documentation

## 2023-02-07 DIAGNOSIS — Z789 Other specified health status: Secondary | ICD-10-CM | POA: Insufficient documentation

## 2023-02-07 DIAGNOSIS — G894 Chronic pain syndrome: Secondary | ICD-10-CM | POA: Insufficient documentation

## 2023-02-07 NOTE — Progress Notes (Signed)
(  02/11/2023) NO-SHOW to initial evaluation.

## 2023-02-07 NOTE — Patient Instructions (Signed)

## 2023-02-11 ENCOUNTER — Ambulatory Visit (HOSPITAL_BASED_OUTPATIENT_CLINIC_OR_DEPARTMENT_OTHER): Payer: Medicaid Other | Admitting: Pain Medicine

## 2023-02-11 DIAGNOSIS — G894 Chronic pain syndrome: Secondary | ICD-10-CM

## 2023-02-11 DIAGNOSIS — Z91199 Patient's noncompliance with other medical treatment and regimen due to unspecified reason: Secondary | ICD-10-CM

## 2023-02-11 DIAGNOSIS — Z79899 Other long term (current) drug therapy: Secondary | ICD-10-CM

## 2023-02-11 DIAGNOSIS — M899 Disorder of bone, unspecified: Secondary | ICD-10-CM

## 2023-02-11 DIAGNOSIS — R937 Abnormal findings on diagnostic imaging of other parts of musculoskeletal system: Secondary | ICD-10-CM | POA: Insufficient documentation

## 2023-02-11 DIAGNOSIS — G8929 Other chronic pain: Secondary | ICD-10-CM

## 2023-02-11 DIAGNOSIS — Z789 Other specified health status: Secondary | ICD-10-CM

## 2023-10-11 ENCOUNTER — Ambulatory Visit: Admitting: Nurse Practitioner

## 2023-10-11 ENCOUNTER — Ambulatory Visit
Admission: RE | Admit: 2023-10-11 | Discharge: 2023-10-11 | Disposition: A | Discharge status: Home / Self Care | Source: Ambulatory Visit | Attending: Nurse Practitioner

## 2023-10-11 VITALS — BP 118/80 | HR 93 | Temp 98.0°F | Ht 70.0 in | Wt 187.2 lb

## 2023-10-11 DIAGNOSIS — R1030 Lower abdominal pain, unspecified: Secondary | ICD-10-CM | POA: Diagnosis not present

## 2023-10-11 DIAGNOSIS — M545 Low back pain, unspecified: Secondary | ICD-10-CM

## 2023-10-11 LAB — POC URINALSYSI DIPSTICK (AUTOMATED)
Bilirubin, UA: NEGATIVE
Blood, UA: NEGATIVE
Glucose, UA: NEGATIVE
Ketones, UA: POSITIVE
Leukocytes, UA: NEGATIVE
Nitrite, UA: NEGATIVE
Protein, UA: POSITIVE — AB
Spec Grav, UA: 1.015 (ref 1.010–1.025)
Urobilinogen, UA: 0.2 U/dL
pH, UA: 6 (ref 5.0–8.0)

## 2023-10-11 MED ORDER — PREDNISONE 20 MG PO TABS: ORAL_TABLET | ORAL | 0 refills | Status: AC

## 2023-10-11 MED ORDER — KETOROLAC TROMETHAMINE 60 MG/2ML IM SOLN
60.0000 mg | Freq: Once | INTRAMUSCULAR | Status: AC
  Administered 2023-10-11: 60 mg via INTRAMUSCULAR

## 2023-10-11 MED ORDER — METHOCARBAMOL 500 MG PO TABS: 500.0000 mg | ORAL_TABLET | Freq: Two times a day (BID) | ORAL | 0 refills | Status: DC | PRN

## 2023-10-11 NOTE — Patient Instructions (Addendum)
 Nice to see you today Avoid NSAIDS like Ibuprofen, Motrin, Aleve , Naproxen , BC/Goody powders, or meloxicam   while on the prednisone Take your Nexium (esomeprazole) while on the prednisone I have sent in a muscle relaxer, use caution as it may cause sedation  Follow up wit me in 3 months for your physical

## 2023-10-11 NOTE — Progress Notes (Signed)
 Acute Office Visit  Subjective:     Patient ID: Willie [name], male    DOB: 11/28/1970, 53 y.o.   MRN: [id number]  Chief Complaint  Patient presents with   Abdomen and back spasm/pain    Pt complains of lower abdomen pain and lower back spasms. Pt states that when he lays down he gets shooting pains in lower abdomen. States that when walking he has sharp back pain. Pains started Monday. No frequent urination, pain, or pressure.     HPI Patient is in today for back pain with a history of spondylolisthesis with radiculopathy. Tobacco use, postlaminectomy syndrone, chronic pain, left foot drop   State sthat it started on Sunday or Monday. It started on bilateral lower back. States that the pain was worse with movement. Statse that on Tuesday it started where it started radiates down to the front and heading to the groin when he lays down. State some discomfort with sitting  States that he had lumbar surgery approx 1.5  States that he has tried 800 ibuprofen and percocet that has helped. Review of Systems  Constitutional:  Negative for chills and fever.  Respiratory:  Negative for shortness of breath.   Cardiovascular:  Negative for chest pain.  Gastrointestinal:  Negative for abdominal pain, nausea and vomiting.  Genitourinary:  Negative for dysuria, frequency and hematuria.  Musculoskeletal:  Positive for back pain.  Neurological:  Negative for tingling, weakness and headaches.        Objective:    BP 118/80   Pulse 93   Temp 98 F (36.7 C) (Oral)   Ht 5' 10 (1.778 m)   Wt 187 lb 3.2 oz (84.9 kg)   SpO2 95%   BMI 26.86 kg/m    Physical Exam Vitals and nursing note reviewed.  Constitutional:      Appearance: Normal appearance.   Cardiovascular:     Rate and Rhythm: Normal rate and regular rhythm.     Heart sounds: Normal heart sounds.  Pulmonary:     Effort: Pulmonary effort is normal.     Breath sounds: Normal breath sounds.  Abdominal:     Tenderness: There  is no abdominal tenderness.   Musculoskeletal:        General: Tenderness present.     Lumbar back: Bony tenderness present. No tenderness. Negative right straight leg raise test and negative left straight leg raise test.       Back:     Comments: Pain with lumbar flexion and extension Pain with bilateral lateral slides Pain with bilateral oblique turns   Neurological:     Mental Status: He is alert.     Deep Tendon Reflexes:     Reflex Scores:      Patellar reflexes are 1+ on the right side and 2+ on the left side.    Comments: Bilateral lower extremity strength 5/5    Results for orders placed or performed in visit on 10/11/23  POCT Urinalysis Dipstick (Automated)  Result Value Ref Range   Color, UA dark yellow    Clarity, UA clear    Glucose, UA Negative Negative   Bilirubin, UA neg    Ketones, UA pos    Spec Grav, UA 1.015 1.010 - 1.025   Blood, UA neg    pH, UA 6.0 5.0 - 8.0   Protein, UA Positive (A) Negative   Urobilinogen, UA 0.2 0.2 or 1.0 E.U./dL   Nitrite, UA neg    Leukocytes, UA Negative Negative  Assessment & Plan:   Problem List Items Addressed This Visit       Other   Lower abdominal pain   UA in office.      Relevant Orders   POCT Urinalysis Dipstick (Automated) (Completed)   Lumbar pain - Primary   Patient with a prior laminectomy acute back pain with some radicular symptoms.  Will treat with prednisone 20 mg taper as directed.  Prednisone precautions reviewed administer Toradol  60 mg IM in office x 1 dose.  NSAID precautions reviewed.  Encourage patient to take his esomeprazole as on the steroid.  Patient was written methocarbamol 500 mg twice daily as needed.  He can take half a tablet if needed as this medication does make him sleepy.  Sedation precautions reviewed.  Signs and symptoms reviewed when to seek emergent health care      Relevant Medications   predniSONE (DELTASONE) 20 MG tablet   methocarbamol (ROBAXIN) 500 MG tablet    Other Relevant Orders   DG Lumbar Spine Complete    Meds ordered this encounter  Medications   ketorolac  (TORADOL ) injection 60 mg   predniSONE (DELTASONE) 20 MG tablet    Sig: Take 2 tablets (40 mg total) by mouth daily with breakfast for 3 days, THEN 1 tablet (20 mg total) daily with breakfast for 3 days. Avoid NSAIDS.    Dispense:  9 tablet    Refill:  0    Supervising Provider:   RANDEEN HARDY A [[id number]]   methocarbamol (ROBAXIN) 500 MG tablet    Sig: Take 1 tablet (500 mg total) by mouth 2 (two) times daily as needed for muscle spasms.    Dispense:  20 tablet    Refill:  0    Supervising Provider:   RANDEEN HARDY A [[id number]]    Return in about 3 months (around 01/11/2024), or if symptoms worsen or fail to improve, for CPE and Labs.  Adina Crandall, NP

## 2023-10-11 NOTE — Assessment & Plan Note (Signed)
 Patient with a prior laminectomy acute back pain with some radicular symptoms.  Will treat with prednisone 20 mg taper as directed.  Prednisone precautions reviewed administer Toradol  60 mg IM in office x 1 dose.  NSAID precautions reviewed.  Encourage patient to take his esomeprazole as on the steroid.  Patient was written methocarbamol 500 mg twice daily as needed.  He can take half a tablet if needed as this medication does make him sleepy.  Sedation precautions reviewed.  Signs and symptoms reviewed when to seek emergent health care

## 2023-10-11 NOTE — Assessment & Plan Note (Signed)
 UA in office

## 2023-10-21 ENCOUNTER — Ambulatory Visit: Payer: Self-pay | Admitting: Nurse Practitioner

## 2023-10-22 NOTE — Telephone Encounter (Signed)
 See my chart encounter.

## 2023-10-31 NOTE — Telephone Encounter (Signed)
 See my chart encounter.

## 2024-01-13 ENCOUNTER — Ambulatory Visit: Payer: Self-pay

## 2024-01-13 NOTE — Telephone Encounter (Signed)
 FYI Only or Action Required?: FYI only for provider.  Patient was last seen in primary care on 10/11/2023 by Wendee Lynwood HERO, NP.  Called Nurse Triage reporting Cough.  Symptoms began several weeks ago.  Interventions attempted: Rest, hydration, or home remedies.  Symptoms are: unchanged.  Triage Disposition: See Physician Within 24 Hours  Patient/caregiver understands and will follow disposition?: Yes  Copied from CRM #[id number]. Topic: Clinical - Red Word Triage >> Jan 13, 2024  4:35 PM Dedra B wrote: [facility] that prompted transfer to Nurse Triage: Pt thinks he has an infection in his lungs. He is coughing up yellow phlehgm and sometimes gags so hard that he passes out. Warm transfer to NT Reason for Disposition  SEVERE coughing spells (e.g., whooping sound after coughing, vomiting after coughing)  Answer Assessment - Initial Assessment Questions 1. ONSET: When did the cough begin?      Started 2-3 weeks ago 2. SEVERITY: How bad is the cough today?      Moderate to severe 3. SPUTUM: Describe the color of your sputum (e.g., none, dry cough; clear, white, yellow, green)     yellow 4. HEMOPTYSIS: Are you coughing up any blood? If Yes, ask: How much? (e.g., flecks, streaks, tablespoons, etc.)     no 5. DIFFICULTY BREATHING: Are you having difficulty breathing? If Yes, ask: How bad is it? (e.g., mild, moderate, severe)      no 6. FEVER: Do you have a fever? If Yes, ask: What is your temperature, how was it measured, and when did it start?     no 7. CARDIAC HISTORY: Do you have any history of heart disease? (e.g., heart attack, congestive heart failure)      no 8. LUNG HISTORY: Do you have any history of lung disease?  (e.g., pulmonary embolus, asthma, emphysema)     no 9. PE RISK FACTORS: Do you have a history of blood clots? (or: recent major surgery, recent prolonged travel, bedridden)     no 10. OTHER SYMPTOMS: Do you have any other symptoms? (e.g.,  runny nose, wheezing, chest pain)       Runny nose 12. TRAVEL: Have you traveled out of the country in the last month? (e.g., travel history, exposures)       no  Protocols used: Cough - Acute Productive-A-AH

## 2024-01-14 ENCOUNTER — Ambulatory Visit: Admitting: Family

## 2024-01-14 ENCOUNTER — Ambulatory Visit: Attending: Family

## 2024-01-14 VITALS — BP 120/74 | HR 107 | Temp 98.4°F | Ht 70.0 in | Wt 186.0 lb

## 2024-01-14 DIAGNOSIS — I517 Cardiomegaly: Secondary | ICD-10-CM

## 2024-01-14 DIAGNOSIS — R55 Syncope and collapse: Secondary | ICD-10-CM

## 2024-01-14 DIAGNOSIS — M545 Low back pain, unspecified: Secondary | ICD-10-CM

## 2024-01-14 DIAGNOSIS — R0782 Intercostal pain: Secondary | ICD-10-CM

## 2024-01-14 DIAGNOSIS — J22 Unspecified acute lower respiratory infection: Secondary | ICD-10-CM

## 2024-01-14 LAB — CBC WITH DIFFERENTIAL/PLATELET
Basophils Absolute: 0 K/uL (ref 0.0–0.1)
Basophils Relative: 0.5 % (ref 0.0–3.0)
Eosinophils Absolute: 0.1 K/uL (ref 0.0–0.7)
Eosinophils Relative: 1.1 % (ref 0.0–5.0)
HCT: 49.2 % (ref 39.0–52.0)
Hemoglobin: 16.8 g/dL (ref 13.0–17.0)
Lymphocytes Relative: 24.8 % (ref 12.0–46.0)
Lymphs Abs: 2.3 K/uL (ref 0.7–4.0)
MCHC: 34.2 g/dL (ref 30.0–36.0)
MCV: 98.5 fl (ref 78.0–100.0)
Monocytes Absolute: 0.8 K/uL (ref 0.1–1.0)
Monocytes Relative: 8.3 % (ref 3.0–12.0)
Neutro Abs: 6.1 K/uL (ref 1.4–7.7)
Neutrophils Relative %: 65.3 % (ref 43.0–77.0)
Platelets: 230 K/uL (ref 150.0–400.0)
RBC: 5 Mil/uL (ref 4.22–5.81)
RDW: 13.2 % (ref 11.5–15.5)
WBC: 9.3 K/uL (ref 4.0–10.5)

## 2024-01-14 LAB — COMPREHENSIVE METABOLIC PANEL WITH GFR
ALT: 37 U/L (ref 0–53)
AST: 35 U/L (ref 0–37)
Albumin: 4.3 g/dL (ref 3.5–5.2)
Alkaline Phosphatase: 60 U/L (ref 39–117)
BUN: 12 mg/dL (ref 6–23)
CO2: 25 meq/L (ref 19–32)
Calcium: 9.6 mg/dL (ref 8.4–10.5)
Chloride: 106 meq/L (ref 96–112)
Creatinine, Ser: 0.99 mg/dL (ref 0.40–1.50)
GFR: 85.94 mL/min (ref >60.00)
Glucose, Bld: 103 mg/dL — ABNORMAL HIGH (ref 70–99)
Potassium: 4.7 meq/L (ref 3.5–5.1)
Sodium: 140 meq/L (ref 135–145)
Total Bilirubin: 0.6 mg/dL (ref 0.2–1.2)
Total Protein: 6.8 g/dL (ref 6.0–8.3)

## 2024-01-14 LAB — VITAMIN B12: Vitamin B-12: 228 pg/mL (ref 211–911)

## 2024-01-14 LAB — TSH: TSH: 0.84 u[IU]/mL (ref 0.35–5.50)

## 2024-01-14 LAB — BRAIN NATRIURETIC PEPTIDE: Pro B Natriuretic peptide (BNP): 5 pg/mL (ref 0.0–100.0)

## 2024-01-14 MED ORDER — DOXYCYCLINE HYCLATE 100 MG PO TABS: 100.0000 mg | ORAL_TABLET | Freq: Two times a day (BID) | ORAL | 0 refills | Status: AC

## 2024-01-14 MED ORDER — METHOCARBAMOL 500 MG PO TABS: 500.0000 mg | ORAL_TABLET | Freq: Two times a day (BID) | ORAL | 0 refills | Status: DC | PRN
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
## 2024-01-14 NOTE — Telephone Encounter (Signed)
 Noted. I appreciate Tabitha's assessment and evaluation

## 2024-01-22 ENCOUNTER — Ambulatory Visit: Payer: Self-pay | Admitting: Family

## 2024-01-22 DIAGNOSIS — R55 Syncope and collapse: Secondary | ICD-10-CM

## 2024-02-12 ENCOUNTER — Ambulatory Visit (HOSPITAL_COMMUNITY)
Admission: RE | Admit: 2024-02-12 | Discharge: 2024-02-12 | Disposition: A | Discharge status: Home / Self Care | Source: Ambulatory Visit | Attending: Family | Admitting: Family

## 2024-02-12 DIAGNOSIS — I517 Cardiomegaly: Secondary | ICD-10-CM | POA: Insufficient documentation

## 2024-02-12 DIAGNOSIS — R55 Syncope and collapse: Secondary | ICD-10-CM | POA: Insufficient documentation

## 2024-02-12 LAB — ECHOCARDIOGRAM COMPLETE
Area-P 1/2: 2.99 cm2
S' Lateral: 3 cm

## 2024-02-18 ENCOUNTER — Ambulatory Visit

## 2024-02-18 DIAGNOSIS — R55 Syncope and collapse: Secondary | ICD-10-CM | POA: Diagnosis not present

## 2024-02-18 MED ORDER — CYANOCOBALAMIN 1000 MCG/ML IJ SOLN
1000.0000 ug | Freq: Once | INTRAMUSCULAR | Status: AC
  Administered 2024-02-18: 1000 ug via INTRAMUSCULAR

## 2024-02-18 NOTE — Progress Notes (Signed)
 Per orders of [employer] DPN AGNP-C, injection of B-12 given by Nellie Hummer in left deltoid. Patient tolerated injection well. Every 3 months

## 2024-02-27 ENCOUNTER — Encounter: Payer: Self-pay | Admitting: Physician Assistant

## 2024-02-27 ENCOUNTER — Ambulatory Visit: Attending: Physician Assistant | Admitting: Physician Assistant

## 2024-02-27 VITALS — BP 126/92 | HR 80 | Ht 70.0 in | Wt 191.0 lb

## 2024-02-27 DIAGNOSIS — Z79899 Other long term (current) drug therapy: Secondary | ICD-10-CM | POA: Insufficient documentation

## 2024-02-27 DIAGNOSIS — R072 Precordial pain: Secondary | ICD-10-CM | POA: Diagnosis not present

## 2024-02-27 DIAGNOSIS — Z720 Tobacco use: Secondary | ICD-10-CM | POA: Insufficient documentation

## 2024-02-27 DIAGNOSIS — R55 Syncope and collapse: Secondary | ICD-10-CM | POA: Insufficient documentation

## 2024-02-27 DIAGNOSIS — F1090 Alcohol use, unspecified, uncomplicated: Secondary | ICD-10-CM | POA: Insufficient documentation

## 2024-02-27 MED ORDER — METOPROLOL TARTRATE 100 MG PO TABS: ORAL_TABLET | ORAL | 0 refills | Status: AC
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
## 2024-02-27 NOTE — Patient Instructions (Signed)
 Medication Instructions:  Your physician recommends that you continue on your current medications as directed. Please refer to the Current Medication list given to you today.   *If you need a refill on your cardiac medications before your next appointment, please call your pharmacy*  Lab Work: Your provider would like for you to have following labs drawn today BMP.   If you have labs (blood work) drawn today and your tests are completely normal, you will receive your results only by: MyChart Message (if you have MyChart) OR A paper copy in the mail If you have any lab test that is abnormal or we need to change your treatment, we will call you to review the results.  Testing/Procedures: Your physician has requested that you have a carotid duplex. This test is an ultrasound of the carotid arteries in your neck. It looks at blood flow through these arteries that supply the brain with blood.   Allow one hour for this exam.  There are no restrictions or special instructions.  This will take place at [address] [facility], [place] [address]  Please note: We ask at that you not bring children with you during ultrasound (echo/ vascular) testing. Due to room size and safety concerns, children are not allowed in the ultrasound rooms during exams. Our front office staff cannot provide observation of children in our lobby area while testing is being conducted. An adult accompanying a patient to their appointment will only be allowed in the ultrasound room at the discretion of the ultrasound technician under special circumstances. We apologize for any inconvenience.    Your cardiac CT will be scheduled at one of the below locations:   [facility] [address] [place], [place] [address] [phone] (Severe contrast allergies only)  OR   [facility] [address] [place], [place] [address] [phone]  OR   [facility] [address] [place], [place] [address] [phone]  OR   [facility]. [facility] and Vascular Tower [address]  [place], [place] [address]  OR   [facility] [address] [place], [place] [phone]  If scheduled at [facility], please arrive at the [facility] and Children's Entrance (Entrance C2) of [facility] 30 minutes prior to test start time. You can use the FREE valet parking offered at entrance C (encouraged to control the heart rate for the test)  Proceed to the [facility] Radiology Department (first floor) to check-in and test prep.  All radiology patients and guests should use entrance C2 at [facility], accessed from [facility], even though the hospital's physical address listed is [address]  If scheduled at the Heart and Vascular Tower at [address], please enter the parking lot using the [address] entrance and use the FREE valet service at the patient drop-off area. Enter the building and check-in with registration on the main floor.  If scheduled at [facility], please arrive to the Heart and Vascular Center 15 mins early for check-in and test prep.  There is spacious parking and easy access to the radiology department from the [facility] Heart and Vascular entrance. Please enter here and check-in with the desk attendant.   If scheduled at [facility], please arrive 30 minutes early for check-in and test prep.  Please follow these instructions carefully (unless otherwise directed):  An IV will be required for this test  and Nitroglycerin will be given.  Hold all erectile dysfunction medications at least 3 days (72 hrs) prior to test. (Ie viagra, cialis, sildenafil, tadalafil, etc)   On the Night Before the Test: Be sure to Drink plenty of water. Do not consume any caffeinated/decaffeinated beverages or chocolate 12 hours prior to your test. Do not take any  antihistamines 12 hours prior to your test.   On the Day of the Test: Drink plenty of water until 1 hour prior to the test. Do not eat any food 1 hour prior to test. You may take your regular medications prior to the test.  Take metoprolol (Lopressor) two hours prior to test. Patients who wear a continuous glucose monitor MUST remove the device prior to scanning.      After the Test: Drink plenty of water. After receiving IV contrast, you may experience a mild flushed feeling. This is normal. On occasion, you may experience a mild rash up to 24 hours after the test. This is not dangerous. If this occurs, you can take Benadryl 25 mg, Zyrtec, Claritin, or Allegra and increase your fluid intake. (Patients taking Tikosyn should avoid Benadryl, and may take Zyrtec, Claritin, or Allegra) If you experience trouble breathing, this can be serious. If it is severe call 911 IMMEDIATELY. If it is mild, please call our office.  We will call to schedule your test 2-4 weeks out understanding that some insurance companies will need an authorization prior to the service being performed.   For more information and frequently asked questions, please visit our website : [email or web address]  For non-scheduling related questions, please contact the cardiac imaging nurse navigator should you have any questions/concerns: Cardiac Imaging Nurse Navigators Direct Office Dial: [phone]   For scheduling needs, including cancellations and rescheduling, please call Brittany, [phone].   Follow-Up: At [facility], you and your health needs are our priority.  As part of our continuing mission to provide you with exceptional heart care, our providers are all part of one team.  This team includes your primary Cardiologist (physician) and Advanced Practice Providers or APPs (Physician Assistants and Nurse Practitioners) who all work together to provide you with the care you need, when you  need it.  Your next appointment:   2 month(s)  Provider:   You may see Bernardino Bring, PA-C

## 2024-02-28 ENCOUNTER — Ambulatory Visit: Payer: Self-pay | Admitting: Physician Assistant

## 2024-02-28 LAB — BASIC METABOLIC PANEL WITH GFR
BUN/Creatinine Ratio: 9 (ref 9–20)
BUN: 11 mg/dL (ref 6–24)
CO2: 23 mmol/L (ref 20–29)
Calcium: 9.4 mg/dL (ref 8.7–10.2)
Chloride: 104 mmol/L (ref 96–106)
Creatinine, Ser: 1.27 mg/dL (ref 0.76–1.27)
Glucose: 80 mg/dL (ref 70–99)
Potassium: 4.5 mmol/L (ref 3.5–5.2)
Sodium: 140 mmol/L (ref 134–144)
eGFR: 67 mL/min/1.73 (ref >59)

## 2024-03-20 ENCOUNTER — Encounter: Payer: Self-pay | Admitting: Nurse Practitioner

## 2024-03-20 ENCOUNTER — Encounter: Payer: Self-pay | Admitting: Emergency Medicine

## 2024-03-20 ENCOUNTER — Ambulatory Visit: Admitting: Nurse Practitioner

## 2024-03-20 VITALS — BP 122/70 | HR 106 | Temp 97.5°F | Ht 70.0 in | Wt 192.0 lb

## 2024-03-20 DIAGNOSIS — M4726 Other spondylosis with radiculopathy, lumbar region: Secondary | ICD-10-CM | POA: Diagnosis not present

## 2024-03-20 DIAGNOSIS — E663 Overweight: Secondary | ICD-10-CM

## 2024-03-20 DIAGNOSIS — M21372 Foot drop, left foot: Secondary | ICD-10-CM

## 2024-03-20 DIAGNOSIS — M961 Postlaminectomy syndrome, not elsewhere classified: Secondary | ICD-10-CM | POA: Diagnosis not present

## 2024-03-20 DIAGNOSIS — R55 Syncope and collapse: Secondary | ICD-10-CM

## 2024-03-20 DIAGNOSIS — Z131 Encounter for screening for diabetes mellitus: Secondary | ICD-10-CM

## 2024-03-20 DIAGNOSIS — Z125 Encounter for screening for malignant neoplasm of prostate: Secondary | ICD-10-CM | POA: Diagnosis not present

## 2024-03-20 DIAGNOSIS — Z13220 Encounter for screening for lipoid disorders: Secondary | ICD-10-CM

## 2024-03-20 DIAGNOSIS — Z720 Tobacco use: Secondary | ICD-10-CM

## 2024-03-20 DIAGNOSIS — Z0000 Encounter for general adult medical examination without abnormal findings: Secondary | ICD-10-CM

## 2024-03-20 DIAGNOSIS — Z23 Encounter for immunization: Secondary | ICD-10-CM | POA: Diagnosis not present

## 2024-03-20 DIAGNOSIS — Z1211 Encounter for screening for malignant neoplasm of colon: Secondary | ICD-10-CM

## 2024-03-20 DIAGNOSIS — Z126 Encounter for screening for malignant neoplasm of bladder: Secondary | ICD-10-CM

## 2024-03-20 DIAGNOSIS — Z122 Encounter for screening for malignant neoplasm of respiratory organs: Secondary | ICD-10-CM

## 2024-03-20 MED ORDER — CYANOCOBALAMIN 1000 MCG/ML IJ SOLN
1000.0000 ug | Freq: Once | INTRAMUSCULAR | Status: AC
  Administered 2024-03-20: 1000 ug via INTRAMUSCULAR

## 2024-03-20 NOTE — Progress Notes (Signed)
 Established Patient Office Visit  Subjective   Patient ID: Willie [name], male    DOB: 02-28-1971  Age: 53 y.o. MRN: [id number]  Chief Complaint  Patient presents with   Annual Exam    HPI  GERD: Patient currently on esomeprazole 40 mg daily. Does well. He states that he takes it as needed   for complete physical and follow up of chronic conditions.  Immunizations: -Tetanus: Completed in 2014? Update today -Influenza: reufsed  -Shingles: refused  -Pneumonia: refused   Diet: Fair diet.  He is eating 1 meal a day. He will snack sometimes. 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 States that he has a cologurard  Lung Cancer Screening: Needs order  PSA: Due  Sleep:going to bed around 9 and will wake up around 3 am until the left leg bothers him. He will get up around 630. Does not feel rested        Review of Systems  Constitutional:  Negative for chills and fever.  Respiratory:  Negative for shortness of breath.   Cardiovascular:  Negative for chest pain and leg swelling.  Gastrointestinal:  Negative for abdominal pain, blood in stool, constipation, diarrhea, nausea and vomiting.       Bm daily   Genitourinary:  Negative for dysuria and hematuria.  Neurological:  Positive for tingling (left foot). Negative for dizziness and headaches.  Psychiatric/Behavioral:  Negative for hallucinations and suicidal ideas.       Objective:     BP 122/70   Pulse (!) 106   Temp (!) 97.5 F (36.4 C) (Oral)   Ht 5' 10 (1.778 m)   Wt 192 lb (87.1 kg)   SpO2 97%   BMI 27.55 kg/m  BP Readings from Last 3 Encounters:  03/20/24 122/70  02/27/24 (!) 126/92  01/14/24 120/74   Wt Readings from Last 3 Encounters:  03/20/24 192 lb (87.1 kg)  02/27/24 191 lb (86.6 kg)  01/14/24 186 lb (84.4 kg)   SpO2 Readings from Last 3 Encounters:   03/20/24 97%  02/27/24 99%  01/14/24 98%      Physical Exam Vitals and nursing note reviewed.  Constitutional:      Appearance: Normal appearance.  HENT:     Right Ear: Tympanic membrane, ear canal and external ear normal.     Left Ear: Tympanic membrane, ear canal and external ear normal.     Mouth/Throat:     Mouth: Mucous membranes are moist.     Pharynx: Oropharynx is clear.  Eyes:     Extraocular Movements: Extraocular movements intact.     Pupils: Pupils are equal, round, and reactive to light.  Cardiovascular:     Rate and Rhythm: Normal rate and regular rhythm.     Pulses: Normal pulses.     Heart sounds: Normal heart sounds.  Pulmonary:     Effort: Pulmonary effort is normal.     Breath sounds: Normal breath sounds.  Abdominal:     General: Bowel sounds are normal. There is no distension.     Palpations: There is no mass.     Tenderness: There is no abdominal tenderness.     Hernia: No hernia is present.  Genitourinary:    Comments: deferred Musculoskeletal:     Right lower leg: No edema.     Left lower leg: No edema.  Lymphadenopathy:     Cervical: No cervical adenopathy.  Skin:    General: Skin is warm.  Neurological:     Mental Status: He is alert. Mental status is at baseline.     Deep Tendon Reflexes:     Reflex Scores:      Bicep reflexes are 2+ on the right side and 2+ on the left side.      Patellar reflexes are 2+ on the right side and 2+ on the left side.    Comments: Bilateral upper extremity strength 5/5 RLE strength 5/5 LLE strength 4/5  Psychiatric:        Mood and Affect: Mood normal.        Behavior: Behavior normal.        Thought Content: Thought content normal.        Judgment: Judgment normal.      No results found for any visits on 03/20/24.    The 10-year ASCVD risk score (Arnett DK, et al., 2019) is: 4.9%    Assessment & Plan:   Problem List Items Addressed This Visit       Cardiovascular and Mediastinum   Near  syncope     Nervous and Auditory   Other spondylosis with radiculopathy, lumbar region   History of the same status post lumbar laminectomy.  Working with previous [employer]. to have a second opinion.        Other   Postlaminectomy syndrome (Chronic)   Preventative health care - Primary   Discussed age-appropriate immunizations and screening exams.  Did review patient's personal, surgical, social, family histories.  Patient up-to-date with all age-appropriate immunizations.  Update tetanus vaccine today.  Patient declined flu, shingles, pneumonia vaccine.  Cologuard ordered for CRC screening.  PSA ordered for prostate cancer screening.  Patient was given information at discharge about preventative healthcare maintenance with anticipatory guidance.      Relevant Orders   Comprehensive metabolic panel with GFR   CBC with Differential/Platelet   TSH   Tobacco use   History of the same encourage cessation      Relevant Orders   Urine Microscopic   Overweight   History of the same difficulty exercising due to lower left extremity discomfort secondary to back surgery.  Pending TSH, A1c, lipid panel.      Acquired left foot drop   Status post lumbar surgery.      Other Visit Diagnoses       Screening for bladder cancer       Relevant Orders   Urine Microscopic     Screening for prostate cancer       Relevant Orders   PSA     Screening for colon cancer       Relevant Orders   Cologuard     Screening for diabetes mellitus       Relevant Orders   Hemoglobin A1c     Screening for lipid disorders       Relevant Orders   Lipid panel     Need for Tdap vaccination       Relevant Orders   Tdap vaccine greater than or equal to 7yo IM (Completed)     Screening for lung cancer       Relevant Orders   Ambulatory Referral Lung Cancer Screening [facility]       Return in about 1 year (around 03/20/2025) for CPE and Labs.    Adina Crandall, NP

## 2024-03-20 NOTE — Assessment & Plan Note (Signed)
 Discussed age-appropriate immunizations and screening exams.  Did review patient's personal, surgical, social, family histories.  Patient up-to-date with all age-appropriate immunizations.  Update tetanus vaccine today.  Patient declined flu, shingles, pneumonia vaccine.  Cologuard ordered for CRC screening.  PSA ordered for prostate cancer screening.  Patient was given information at discharge about preventative healthcare maintenance with anticipatory guidance.

## 2024-03-20 NOTE — Patient Instructions (Signed)
 Nice to see you today I will be in touch with the labs once I have them Follow up with me in 1 year, sooner if you need me   We did update your tetanus vaccine today

## 2024-03-20 NOTE — Assessment & Plan Note (Signed)
 History of the same difficulty exercising due to lower left extremity discomfort secondary to back surgery.  Pending TSH, A1c, lipid panel.

## 2024-03-20 NOTE — Assessment & Plan Note (Signed)
 History of the same encourage cessation

## 2024-03-20 NOTE — Assessment & Plan Note (Signed)
 Status post lumbar surgery.

## 2024-03-20 NOTE — Assessment & Plan Note (Signed)
 History of the same status post lumbar laminectomy.  Working with previous [employer]. to have a second opinion.

## 2024-03-21 LAB — CBC WITH DIFFERENTIAL/PLATELET
Absolute Lymphocytes: 2545 {cells}/uL (ref 850–3900)
Absolute Monocytes: 808 {cells}/uL (ref 200–950)
Basophils Absolute: 51 {cells}/uL (ref 0–200)
Basophils Relative: 0.5 %
Eosinophils Absolute: 101 {cells}/uL (ref 15–500)
Eosinophils Relative: 1 %
HCT: 47.8 % (ref 39.4–51.1)
Hemoglobin: 16.6 g/dL (ref 13.2–17.1)
MCH: 33.5 pg — ABNORMAL HIGH (ref 27.0–33.0)
MCHC: 34.7 g/dL (ref 31.6–35.4)
MCV: 96.6 fL (ref 81.4–101.7)
MPV: 10.6 fL (ref 7.5–12.5)
Monocytes Relative: 8 %
Neutro Abs: 6595 {cells}/uL (ref 1500–7800)
Neutrophils Relative %: 65.3 %
Platelets: 263 Thousand/uL (ref 140–400)
RBC: 4.95 Million/uL (ref 4.20–5.80)
RDW: 11.6 % (ref 11.0–15.0)
Total Lymphocyte: 25.2 %
WBC: 10.1 Thousand/uL (ref 3.8–10.8)

## 2024-03-21 LAB — COMPREHENSIVE METABOLIC PANEL WITH GFR
AG Ratio: 1.6 (calc) (ref 1.0–2.5)
ALT: 21 U/L (ref 9–46)
AST: 21 U/L (ref 10–35)
Albumin: 4.1 g/dL (ref 3.6–5.1)
Alkaline phosphatase (APISO): 66 U/L (ref 35–144)
BUN/Creatinine Ratio: 14 (calc) (ref 6–22)
BUN: 19 mg/dL (ref 7–25)
CO2: 28 mmol/L (ref 20–32)
Calcium: 9.5 mg/dL (ref 8.6–10.3)
Chloride: 106 mmol/L (ref 98–110)
Creat: 1.35 mg/dL — ABNORMAL HIGH (ref 0.70–1.30)
Globulin: 2.6 g/dL (ref 1.9–3.7)
Glucose, Bld: 84 mg/dL (ref 65–99)
Potassium: 4.6 mmol/L (ref 3.5–5.3)
Sodium: 141 mmol/L (ref 135–146)
Total Bilirubin: 0.4 mg/dL (ref 0.2–1.2)
Total Protein: 6.7 g/dL (ref 6.1–8.1)
eGFR: 62 mL/min/1.73m2 (ref >=60)

## 2024-03-21 LAB — URINALYSIS, MICROSCOPIC ONLY
Bacteria, UA: NONE SEEN /HPF
Hyaline Cast: NONE SEEN /LPF
RBC / HPF: NONE SEEN /HPF (ref 0–2)
Squamous Epithelial / HPF: NONE SEEN /HPF (ref <=5)
WBC, UA: NONE SEEN /HPF (ref 0–5)

## 2024-03-21 LAB — LIPID PANEL
Cholesterol: 143 mg/dL
HDL: 73 mg/dL
LDL Cholesterol (Calc): 55 mg/dL
Non-HDL Cholesterol (Calc): 70 mg/dL
Total CHOL/HDL Ratio: 2 (calc)
Triglycerides: 71 mg/dL

## 2024-03-21 LAB — HEMOGLOBIN A1C
Hgb A1c MFr Bld: 5.3 %
Mean Plasma Glucose: 105 mg/dL
eAG (mmol/L): 5.8 mmol/L

## 2024-03-21 LAB — TSH: TSH: 0.76 m[IU]/L (ref 0.40–4.50)

## 2024-03-21 LAB — PSA: PSA: 0.4 ng/mL (ref <=4.00)

## 2024-03-23 ENCOUNTER — Ambulatory Visit: Payer: Self-pay | Admitting: Nurse Practitioner

## 2024-03-25 ENCOUNTER — Encounter (HOSPITAL_COMMUNITY): Payer: Self-pay

## 2024-03-25 ENCOUNTER — Telehealth (HOSPITAL_COMMUNITY): Payer: Self-pay | Admitting: Emergency Medicine

## 2024-03-25 NOTE — Telephone Encounter (Signed)
 Attempted to call patient regarding upcoming cardiac CT appointment. Left message on voicemail with name and callback number Rockwell Alexandria RN Navigator Cardiac Imaging [facility] Heart and Vascular Services [phone] Office [phone] Cell

## 2024-03-26 ENCOUNTER — Ambulatory Visit
Admission: RE | Admit: 2024-03-26 | Discharge: 2024-03-26 | Disposition: A | Discharge status: Home / Self Care | Source: Ambulatory Visit | Attending: Physician Assistant | Admitting: Physician Assistant

## 2024-03-26 DIAGNOSIS — R072 Precordial pain: Secondary | ICD-10-CM | POA: Diagnosis present

## 2024-03-26 MED ORDER — NITROGLYCERIN 0.4 MG SL SUBL
0.8000 mg | SUBLINGUAL_TABLET | Freq: Once | SUBLINGUAL | Status: AC
  Administered 2024-03-26: 0.8 mg via SUBLINGUAL
  Filled 2024-03-26: qty 25

## 2024-03-26 MED ORDER — METOPROLOL TARTRATE 5 MG/5ML IV SOLN: 10.0000 mg | Freq: Once | INTRAVENOUS | Status: DC | PRN

## 2024-03-26 MED ORDER — IOHEXOL 350 MG/ML SOLN
100.0000 mL | Freq: Once | INTRAVENOUS | Status: AC | PRN
  Administered 2024-03-26: 100 mL via INTRAVENOUS

## 2024-03-26 MED ORDER — DILTIAZEM HCL 25 MG/5ML IV SOLN: 10.0000 mg | INTRAVENOUS | Status: DC | PRN

## 2024-03-26 NOTE — Progress Notes (Signed)
 Pt. Tolerated CT well. VSS. Pt. Encouraged to drink fluids post procedure. Pt. Verbalized understanding of post CT instructions.  Ambulated pt.: steady gait.

## 2024-03-27 ENCOUNTER — Ambulatory Visit: Attending: Physician Assistant

## 2024-03-27 DIAGNOSIS — R55 Syncope and collapse: Secondary | ICD-10-CM | POA: Diagnosis not present

## 2024-04-06 ENCOUNTER — Telehealth: Payer: Self-pay | Admitting: Acute Care

## 2024-04-06 DIAGNOSIS — Z87891 Personal history of nicotine dependence: Secondary | ICD-10-CM

## 2024-04-06 DIAGNOSIS — F17210 Nicotine dependence, cigarettes, uncomplicated: Secondary | ICD-10-CM

## 2024-04-06 DIAGNOSIS — Z122 Encounter for screening for malignant neoplasm of respiratory organs: Secondary | ICD-10-CM

## 2024-04-06 NOTE — Telephone Encounter (Signed)
 Lung Cancer Screening Narrative/Criteria Questionnaire (Cigarette Smokers Only- No Cigars/Pipes/vapes)   Willie [name]   SDMV:04/13/2024 1:30p Wells       [birth year]   LDCT: 04/14/2024 4:30p [facility]    53 y.o.   Phone: [phone]  Lung Screening Narrative (confirm age 95-77 yrs Medicare / 50-80 yrs Private pay insurance)   Insurance information:mcd   Referring Provider:James Cable NP   This screening involves an initial phone call with a team member from our program. It is called a shared decision making visit. The initial meeting is required by  insurance and Medicare to make sure you understand the program. This appointment takes about 15-20 minutes to complete. You will complete the screening scan at your scheduled date/time.  This scan takes about 5-10 minutes to complete. You can eat and drink normally before and after the scan.  Criteria questions for Lung Cancer Screening:   Are you a current or former smoker? Current Age began smoking: 53yo   If you are a former smoker, what year did you quit smoking? N/A(within 15 yrs)   To calculate your smoking history, I need an accurate estimate of how many packs of cigarettes you smoked per day and for how many years. (Not just the number of PPD you are now smoking)   Years smoking 39 x Packs per day 1 = Pack years 39   (at least 20 pack yrs)   (Make sure they understand that we need to know how much they have smoked in the past, not just the number of PPD they are smoking now)  Do you have a personal history of cancer?  No    Do you have a family history of cancer? Yes  (cancer type and and relative) mother - unsure of type   Are you coughing up blood?  No  Have you had unexplained weight loss of 15 lbs or more in the last 6 months? No  It looks like you meet all criteria.  When would be a good time for us  to schedule you for this screening?   Additional information: N/A

## 2024-04-13 ENCOUNTER — Ambulatory Visit: Admitting: Acute Care

## 2024-04-13 DIAGNOSIS — Z720 Tobacco use: Secondary | ICD-10-CM

## 2024-04-13 DIAGNOSIS — F17210 Nicotine dependence, cigarettes, uncomplicated: Secondary | ICD-10-CM

## 2024-04-13 DIAGNOSIS — Z122 Encounter for screening for malignant neoplasm of respiratory organs: Secondary | ICD-10-CM

## 2024-04-13 NOTE — Progress Notes (Signed)
 Virtual Visit via Telephone Note  I connected with Willie [name] on 04/13/2024 at  1:30 PM EST by telephone and verified that I am speaking with the correct person using two identifiers.  Location: Patient: At home, in [place]  Provider: [address] [place], [place], [address]    I discussed the limitations, risks, security and privacy concerns of performing an evaluation and management service by telephone and the availability of in person appointments. I also discussed with the patient that there may be a patient responsible charge related to this service. The patient expressed understanding and agreed to proceed.  Shared Decision Making Visit Lung Cancer Screening Program [phone])   Eligibility: Age 53 y.o. Pack Years Smoking History Calculation 39 pack years  (# packs/per year x # years smoked) Recent History of coughing up blood  no Unexplained weight loss? no ( >Than 15 pounds within the last 6 months ) Prior History Lung / other cancer no (Diagnosis within the last 5 years already requiring surveillance chest CT Scans). Smoking Status Current Smoker Former Smokers: Years since quit: N/A  Quit Date: N/A  Visit Components: Discussion included one or more decision making aids. yes Discussion included risk/benefits of screening. yes Discussion included potential follow up diagnostic testing for abnormal scans. yes Discussion included meaning and risk of over diagnosis. yes Discussion included meaning and risk of False Positives. yes Discussion included meaning of total radiation exposure. yes  Counseling Included: Importance of adherence to annual lung cancer LDCT screening. yes Impact of comorbidities on ability to participate in the program. yes Ability and willingness to under diagnostic treatment. yes  Smoking Cessation Counseling: Current Smokers:  Discussed importance of smoking cessation. yes Information about tobacco cessation classes and interventions  provided to patient. yes Patient provided with ticket for LDCT Scan. N/A Symptomatic Patient. yes  Counseling(Intermediate counseling: > three minutes) [address] Diagnosis Code: Tobacco Use Z72.0 Asymptomatic Patient no  Counseling  Former Smokers:  Discussed the importance of maintaining cigarette abstinence. N/A Diagnosis Code: Personal History of Nicotine  Dependence. S12.108 Information about tobacco cessation classes and interventions provided to patient. Yes Patient provided with ticket for LDCT Scan. N/A Written Order for Lung Cancer Screening with LDCT placed in Epic. Yes (CT Chest Lung Cancer Screening Low Dose W/O CM) [id number] Z12.2-Screening of respiratory organs Z87.891-Personal history of nicotine  dependence  Willie [name] is a current user of tobacco or nicotine  products. He is considering quitting at this time. Counseling provided today addressed the risks of continued use and the benefits of cessation. Discussed tobacco/nicotine  use history, readiness to quit, and evidence-based treatment options including behavioral strategies, support resources, and pharmacologic therapies. Provided encouragement and educational materials on steps and resources to quit smoking. Patient questions were addressed, and follow-up recommended for continued support. Total time spent on counseling: 4 minutes.   Shared decision visit completed by Wells Georgia, FNP as a registered nurse awaiting credentialing.    Wells CHRISTELLA Georgia, FNP

## 2024-04-13 NOTE — Patient Instructions (Addendum)
 Thank you for participating in the [place] Lung Cancer Screening Program. It was our pleasure to meet you today. We will call you with the results of your scan within the next few days. Your scan will be assigned a Lung RADS category score by the physicians reading the scans.  This Lung RADS score determines follow up scanning.  See below for description of categories, and follow up screening recommendations. We will be in touch to schedule your follow up screening annually or based on recommendations of our providers. We will fax a copy of your scan results to your Primary Care Physician, or the physician who referred you to the program, to ensure they have the results. Please call the office if you have any questions or concerns regarding your scanning experience or results.  Our office number is [phone]. Please speak with Karna Curly, RN., Karna Doom RN, or [facility] RN, and Isaiah Dover RN. They are  our Lung Cancer Screening RN.'s If They are unavailable when you call, Please leave a message on the voice mail. We will return your call at our earliest convenience.This voice mail is monitored several times a day.  Remember, if your scan is normal, we will scan you annually as long as you continue to meet the criteria for the program. (Age 35-80, Current smoker or smoker who has quit within the last 15 years). If you are a smoker, remember, quitting is the single most powerful action that you can take to decrease your risk of lung cancer and other pulmonary, breathing related problems. We know quitting is hard, and we are here to help.  Please let us  know if there is anything we can do to help you meet your goal of quitting. If you are a former smoker, counselling psychologist. We are proud of you! Remain smoke free! Remember you can refer friends or family members through the number above.  We will screen them to make sure they meet criteria for the program. Thank you for helping us   take better care of you by participating in Lung Screening.  For Virtual Smoking Cessation Classes , The American Lung Association Provides  Freedom From Smoking Classes.  Please search their website for dates and times.    Lung RADS Categories:  Lung RADS 1: no nodules or definitely non-concerning nodules.  Recommendation is for a repeat annual scan in 12 months.  Lung RADS 2:  nodules that are non-concerning in appearance and behavior with a very low likelihood of becoming an active cancer. Recommendation is for a repeat annual scan in 12 months.  Lung RADS 3: nodules that are probably non-concerning , includes nodules with a low likelihood of becoming an active cancer.  Recommendation is for a 58-month repeat screening scan. Often noted after an upper respiratory illness. We will be in touch to make sure you have no questions, and to schedule your 85-month scan.  Lung RADS 4 A: nodules with concerning findings, recommendation is most often for a follow up scan in 3 months or additional testing based on our provider's assessment of the scan. We will be in touch to make sure you have no questions and to schedule the recommended 3 month follow up scan.  Lung RADS 4 B:  indicates findings that are concerning. We will be in touch with you to schedule additional diagnostic testing based on our provider's  assessment of the scan.  You can receive free nicotine  replacement therapy ( patches, gum or mints) by calling 1-800-QUIT NOW.  Please call so we can get you on the path to becoming  a non-smoker. I know it is hard, but you can do this!  Other options for assistance in smoking cessation ( As covered by your insurance benefits)  Hypnosis for smoking cessation  [employer] 8136704130  Acupuncture for smoking cessation  [facility] [phone]    Freedom From Smoking  Virtual Group, FREE to [place] residents  (Class sizes are capped at 16 people) January  7th-February18th Wednesdays 6:15 pm- 7:45 pm  [email or web address] Contact:   Kimetha Fulwood   [email or web address]  [phone]   [employer] Offers tools and tips to quit smoking.  Free quitSTART app:   Monitor progress, manage cravings, access tools, and more with the app.  portablegrid.se   Your CT scan is scheduled for 04/14/2025 at 4:30 pm at [facility].

## 2024-04-14 ENCOUNTER — Ambulatory Visit
Admission: RE | Admit: 2024-04-14 | Discharge: 2024-04-14 | Disposition: A | Discharge status: Home / Self Care | Source: Ambulatory Visit | Attending: Acute Care | Admitting: Acute Care

## 2024-04-14 DIAGNOSIS — Z87891 Personal history of nicotine dependence: Secondary | ICD-10-CM | POA: Diagnosis present

## 2024-04-14 DIAGNOSIS — Z122 Encounter for screening for malignant neoplasm of respiratory organs: Secondary | ICD-10-CM | POA: Diagnosis present

## 2024-04-14 DIAGNOSIS — F17210 Nicotine dependence, cigarettes, uncomplicated: Secondary | ICD-10-CM | POA: Insufficient documentation

## 2024-04-15 ENCOUNTER — Encounter: Payer: Self-pay | Admitting: Nurse Practitioner

## 2024-04-15 DIAGNOSIS — K760 Fatty (change of) liver, not elsewhere classified: Secondary | ICD-10-CM

## 2024-04-15 NOTE — Telephone Encounter (Signed)
 Called patient ho GI doctor yet. Ok with referral would like [place].

## 2024-04-22 ENCOUNTER — Ambulatory Visit: Payer: Self-pay

## 2024-04-22 NOTE — Telephone Encounter (Signed)
 FYI Only or Action Required?: FYI only for provider: appointment scheduled on 04/24/24.  Patient was last seen in primary care on 03/20/2024 by Wendee Lynwood HERO, NP.  Called Nurse Triage reporting Nasal Congestion.  Symptoms began several days ago.  Interventions attempted: OTC medications: Mucinex, Nyquil.  Symptoms are: unchanged.  Triage Disposition: See PCP When Office is Open (Within 3 Days) (overriding Home Care)  Patient/caregiver understands and will follow disposition?: Yes   Reason for Disposition  [1] Sinus congestion as part of a cold AND [2] present < 10 days  Answer Assessment - Initial Assessment Questions Taking Mucinex and Nyquil.  1. LOCATION: Where does it hurt?      In between eyes, forehead  2. ONSET: When did the sinus pain start?  (e.g., hours, days)      2 days ago  3. SEVERITY: How bad is the pain?   (Scale 0-10; or none, mild, moderate or severe)     Worsening  4. NASAL CONGESTION: Is the nose blocked? If Yes, ask: Can you open it or must you breathe through your mouth?     Yes, mouth breathing  5. NASAL DISCHARGE: Do you have discharge from your nose? If so ask, What color?     Bright yellow  6. FEVER: Do you have a fever? If Yes, ask: What is it, how was it measured, and when did it start?      Unable to assess  7. OTHER SYMPTOMS: Do you have any other symptoms? (e.g., sore throat, cough, earache, difficulty breathing)     Mild cough, diarrhea just started today, just once. Ears feel full  Protocols used: Sinus Pain or Congestion-A-AH  Message from Alexandria E sent at 04/22/2024  2:27 PM EST  Summary: Diarrhea, congestion, body aches   Reason for Triage: Diarrhea, congestion, body aches. Patient called in to cancel his nurse visit for tomorrow, got this cancelled, would like advise from nurse if there is anything he can do to feel better.

## 2024-04-23 ENCOUNTER — Ambulatory Visit

## 2024-04-23 ENCOUNTER — Other Ambulatory Visit: Payer: Self-pay

## 2024-04-23 DIAGNOSIS — Z122 Encounter for screening for malignant neoplasm of respiratory organs: Secondary | ICD-10-CM

## 2024-04-23 DIAGNOSIS — F17210 Nicotine dependence, cigarettes, uncomplicated: Secondary | ICD-10-CM

## 2024-04-23 DIAGNOSIS — Z87891 Personal history of nicotine dependence: Secondary | ICD-10-CM

## 2024-04-23 NOTE — Telephone Encounter (Signed)
 Noted

## 2024-04-24 ENCOUNTER — Ambulatory Visit: Admitting: Family Medicine

## 2024-04-24 VITALS — BP 132/96 | HR 96 | Temp 98.4°F | Ht 70.0 in | Wt 187.0 lb

## 2024-04-24 DIAGNOSIS — J069 Acute upper respiratory infection, unspecified: Secondary | ICD-10-CM | POA: Diagnosis not present

## 2024-04-24 NOTE — Progress Notes (Signed)
 "   Patient ID: Willie [name], male    DOB: 11/12/1970, 54 y.o.   MRN: [id number]  This visit was conducted in person.  BP (!) 132/96   Pulse 96   Temp 98.4 F (36.9 C) (Temporal)   Ht 5' 10 (1.778 m)   Wt 187 lb (84.8 kg)   SpO2 97%   BMI 26.83 kg/m    CC:  Chief Complaint  Patient presents with   Generalized Body Aches    Started on Monday    Nasal Congestion   Diarrhea    X 1    Subjective:   HPI: Willie [name] is a 54 y.o. male presenting on 04/24/2024 for Generalized Body Aches (Started on Monday/), Nasal Congestion, and Diarrhea (X 1)   Date of onset:  5 days Initial symptoms included  dizziness, achy body, hot/cold, nasal congestion.. caused some SOB with breathing through nose Symptoms progressed to sinus congestion, no face pain, ears fullness.  NO SOB, no wheeze  Feeling better overall. Back at work in last 2 days.   Sick contacts:  none COVID testing:   none    He has tried to treat with  mucinex     No history of chronic lung disease such as asthma or COPD.   Current smoker.       Relevant past medical, surgical, family and social history reviewed and updated as indicated. Interim medical history since our last visit reviewed. Allergies and medications reviewed and updated. Outpatient Medications Prior to Visit  Medication Sig Dispense Refill   esomeprazole (NEXIUM) 40 MG capsule Take 40 mg by mouth daily as needed (for heartburn).     HYDROcodone -acetaminophen  (NORCO) 5-325 MG tablet Take 1 tablet by mouth every 6 (six) hours as needed for moderate pain. 15 tablet 0   ibuprofen (ADVIL) 800 MG tablet Take 800 mg by mouth every 8 (eight) hours as needed.     meloxicam  (MOBIC ) 15 MG tablet Take 15 mg by mouth daily as needed for pain.     metoprolol  tartrate (LOPRESSOR ) 100 MG tablet TAKE 1 TABLET 2 HR PRIOR TO CARDIAC PROCEDURE 1 tablet 0   meloxicam  (MOBIC ) 15 MG tablet Take by mouth. (Patient taking differently: Take 15 mg by mouth daily as needed  for pain.)     No facility-administered medications prior to visit.     Per HPI unless specifically indicated in ROS section below Review of Systems  Constitutional:  Positive for fatigue. Negative for fever.  HENT:  Positive for congestion, ear pain, postnasal drip and sinus pressure.   Eyes:  Negative for pain.  Respiratory:  Positive for cough. Negative for shortness of breath.   Cardiovascular:  Negative for chest pain, palpitations and leg swelling.  Gastrointestinal:  Negative for abdominal pain.  Genitourinary:  Negative for dysuria.  Musculoskeletal:  Negative for arthralgias.  Neurological:  Negative for syncope, light-headedness and headaches.  Psychiatric/Behavioral:  Negative for dysphoric mood.    Objective:  BP (!) 132/96   Pulse 96   Temp 98.4 F (36.9 C) (Temporal)   Ht 5' 10 (1.778 m)   Wt 187 lb (84.8 kg)   SpO2 97%   BMI 26.83 kg/m   Wt Readings from Last 3 Encounters:  04/24/24 187 lb (84.8 kg)  03/20/24 192 lb (87.1 kg)  02/27/24 191 lb (86.6 kg)      Physical Exam Constitutional:      General: He is not in acute distress.    Appearance: Normal  appearance. He is well-developed. He is not ill-appearing or toxic-appearing.  HENT:     Head: Normocephalic and atraumatic.     Right Ear: Hearing, tympanic membrane, ear canal and external ear normal. No tenderness. No foreign body. Tympanic membrane is not retracted or bulging.     Left Ear: Hearing, tympanic membrane, ear canal and external ear normal. No tenderness. No foreign body. Tympanic membrane is not retracted or bulging.     Nose: Nose normal. No mucosal edema or rhinorrhea.     Right Sinus: No maxillary sinus tenderness or frontal sinus tenderness.     Left Sinus: No maxillary sinus tenderness or frontal sinus tenderness.     Mouth/Throat:     Dentition: Normal dentition. No dental caries.     Pharynx: Uvula midline. No oropharyngeal exudate.     Tonsils: No tonsillar abscesses.  Eyes:      General: Lids are normal. Lids are everted, no foreign bodies appreciated.     Conjunctiva/sclera: Conjunctivae normal.     Pupils: Pupils are equal, round, and reactive to light.  Neck:     Thyroid : No thyroid  mass or thyromegaly.     Vascular: No carotid bruit.     Trachea: Trachea and phonation normal.  Cardiovascular:     Rate and Rhythm: Normal rate and regular rhythm.     Pulses: Normal pulses.     Heart sounds: Normal heart sounds, S1 normal and S2 normal. No murmur heard.    No gallop.  Pulmonary:     Effort: Pulmonary effort is normal. No respiratory distress.     Breath sounds: Normal breath sounds. No wheezing, rhonchi or rales.  Abdominal:     General: Bowel sounds are normal.     Palpations: Abdomen is soft.     Tenderness: There is no abdominal tenderness. There is no guarding or rebound.     Hernia: No hernia is present.  Musculoskeletal:     Cervical back: Normal range of motion and neck supple.  Skin:    General: Skin is warm and dry.     Findings: No rash.  Neurological:     Mental Status: He is alert.     Deep Tendon Reflexes: Reflexes are normal and symmetric.  Psychiatric:        Speech: Speech normal.        Behavior: Behavior normal.        Judgment: Judgment normal.       Results for orders placed or performed in visit on 03/20/24  Comprehensive metabolic panel with GFR   Collection Time: 03/20/24  2:36 PM  Result Value Ref Range   Glucose, Bld 84 65 - 99 mg/dL   BUN 19 7 - 25 mg/dL   Creat 8.64 (H) 9.29 - 1.30 mg/dL   eGFR 62 > OR = 60 fO/fpw/8.26f7   BUN/Creatinine Ratio 14 6 - 22 (calc)   Sodium 141 135 - 146 mmol/L   Potassium 4.6 3.5 - 5.3 mmol/L   Chloride 106 98 - 110 mmol/L   CO2 28 20 - 32 mmol/L   Calcium 9.5 8.6 - 10.3 mg/dL   Total Protein 6.7 6.1 - 8.1 g/dL   Albumin 4.1 3.6 - 5.1 g/dL   Globulin 2.6 1.9 - 3.7 g/dL (calc)   AG Ratio 1.6 1.0 - 2.5 (calc)   Total Bilirubin 0.4 0.2 - 1.2 mg/dL   Alkaline phosphatase (APISO) 66 35  - 144 U/L   AST 21 10 - 35 U/L  ALT 21 9 - 46 U/L  CBC with Differential/Platelet   Collection Time: 03/20/24  2:36 PM  Result Value Ref Range   WBC 10.1 3.8 - 10.8 Thousand/uL   RBC 4.95 4.20 - 5.80 Million/uL   Hemoglobin 16.6 13.2 - 17.1 g/dL   HCT 52.1 60.5 - 48.8 %   MCV 96.6 81.4 - 101.7 fL   MCH 33.5 (H) 27.0 - 33.0 pg   MCHC 34.7 31.6 - 35.4 g/dL   RDW 88.3 88.9 - 84.9 %   Platelets 263 140 - 400 Thousand/uL   MPV 10.6 7.5 - 12.5 fL   Neutro Abs 6,595 1,500 - 7,800 cells/uL   Absolute Lymphocytes 2,545 850 - 3,900 cells/uL   Absolute Monocytes 808 200 - 950 cells/uL   Eosinophils Absolute 101 15 - 500 cells/uL   Basophils Absolute 51 0 - 200 cells/uL   Neutrophils Relative % 65.3 %   Total Lymphocyte 25.2 %   Monocytes Relative 8.0 %   Eosinophils Relative 1.0 %   Basophils Relative 0.5 %  Hemoglobin A1c   Collection Time: 03/20/24  2:36 PM  Result Value Ref Range   Hgb A1c MFr Bld 5.3 <5.7 %   Mean Plasma Glucose 105 mg/dL   eAG (mmol/L) 5.8 mmol/L  TSH   Collection Time: 03/20/24  2:36 PM  Result Value Ref Range   TSH 0.76 0.40 - 4.50 mIU/L  Urine Microscopic   Collection Time: 03/20/24  2:36 PM  Result Value Ref Range   WBC, UA NONE SEEN 0 - 5 /HPF   RBC / HPF NONE SEEN 0 - 2 /HPF   Squamous Epithelial / HPF NONE SEEN < OR = 5 /HPF   Bacteria, UA NONE SEEN NONE SEEN /HPF   Hyaline Cast NONE SEEN NONE SEEN /LPF   Note    PSA   Collection Time: 03/20/24  2:36 PM  Result Value Ref Range   PSA 0.40 < OR = 4.00 ng/mL  Lipid panel   Collection Time: 03/20/24  2:36 PM  Result Value Ref Range   Cholesterol 143 <200 mg/dL   HDL 73 > OR = 40 mg/dL   Triglycerides 71 <849 mg/dL   LDL Cholesterol (Calc) 55 mg/dL (calc)   Total CHOL/HDL Ratio 2.0 <5.0 (calc)   Non-HDL Cholesterol (Calc) 70 <869 mg/dL (calc)    Assessment and Plan  There are no diagnoses linked to this encounter.  No follow-ups on file.   Greig Ring, MD  "

## 2024-04-24 NOTE — Assessment & Plan Note (Signed)
 Acute, upper respiratory viral infection, possible flu or COVID on day 5 of illness.  Offered testing for these but he is not interested in specific treatment if positive.  He is already improving significantly. No red flags or indication for imaging. No sign of bacterial infection.  Recommend continued Mucinex, start Flonase 2 sprays per nostril daily as well as nasal saline irrigation.  Call if not improving as expected.  Go to ER if severe shortness of breath.

## 2024-04-25 NOTE — Progress Notes (Unsigned)
 "  Cardiology Office Note    Date:  04/25/2024   ID:  Willie, [name] [birth year], MRN [id number]  PCP:  Willie Willie CHRISTELLA, NP  Cardiologist:  None  Electrophysiologist:  None   Chief Complaint: Follow up  History of Present Illness:   Willie [name] is a 54 y.o. male with history of normal coronary arteries by coronary CTA in 03/2024, syncope/near syncope, diastolic dysfunction, lumbosacral radiculopathy with left foot drop, and GERD who presents for follow-up of coronary CTA and carotid artery ultrasound.  He was evaluated by his PCP on 01/14/2024 for evaluation of coughing episodes with associated near syncope. He reported episodes of productive cough of green to yellow sputum with an episode of syncope after a severe coughing episode.  At that visit, he also reported an episode of syncope while driving around 04/7972. He did not seek medical attention at that time.  EKG in their office showed sinus rhythm with no acute ST-T changes.  Subsequent Zio patch in 12/2023 showed a predominant rhythm of sinus with an average rate of 92 bpm (range 49-1 79), 1 episode of SVT lasting 5 beats, and rare atrial and ventricular ectopy.  Second monitor showed a predominant rhythm of sinus with an average rate of 93 bpm with a range of 48 to 240 bpm with 1 episode of NSVT lasting 5 beats and rare atrial and ventricular ectopy.  One symptom triggered episode corresponded to sinus rhythm.  Echo in 01/2024 showed an EF of 55 to 60%, no regional wall motion abnormalities, grade 1 diastolic dysfunction, normal RV systolic function and ventricular cavity size, no significant valvular abnormality, and normal CVP.    He was evaluated in our office on 02/27/2024 and was without further syncopal episodes.  He indicated he felt like his syncopal episode in August may have been in the setting of dehydration after working out in the heat.  He did report some intermittent left upper arm numbness and left upper chest discomfort.   Coronary CTA in 03/2024 showed a calcium score of 0 with no evidence of CAD.  Carotid artery ultrasound in 03/2024 showed no evidence of stenosis in the bilateral ICAs with antegrade flow of the bilateral vertebral arteries and normal flow hemodynamics in the bilateral subclavian arteries.  ***   Labs independently reviewed: 03/2024 - TC 143, TG 71, HDL 73, LDL 55, TSH normal, A1c 5.3, Hgb 16.6, PLT 263, BUN 19, serum creatinine 1.35, potassium 4.6, albumin 4.1, AST/ALT normal 12/2023 - BNP 5  Past Medical History:  Diagnosis Date   Acute fulminating appendicitis with perforation and peritonitis 02/13/2013   Lap appy on [id number]   GERD (gastroesophageal reflux disease)     Past Surgical History:  Procedure Laterality Date   APPENDECTOMY     LAPAROSCOPIC APPENDECTOMY N/A 02/13/2013   Procedure: APPENDECTOMY LAPAROSCOPIC;  Surgeon: Willie MALVA Pina, MD;  Location: [facility] OR;  Service: General;  Laterality: N/A;   LUMBAR LAMINECTOMY      Current Medications: Active Medications[1]  Allergies:   Patient has no known allergies.   Social History   Socioeconomic History   Marital status: Single    Spouse name: Not on file   Number of children: 7   Years of education: Not on file   Highest education level: Not on file  Occupational History   Not on file  Tobacco Use   Smoking status: Every Day    Current packs/day: 1.00    Average packs/day: 1 pack/day for 25.0  years (25.0 ttl pk-yrs)    Types: Cigarettes   Smokeless tobacco: Never  Vaping Use   Vaping status: Never Used  Substance and Sexual Activity   Alcohol use: Not Currently    Alcohol/week: 18.0 standard drinks of alcohol    Types: 18 Cans of beer per week    Comment: 6-8 daily   Drug use: No   Sexual activity: Not Currently    Birth control/protection: None  Other Topics Concern   Not on file  Social History Narrative   7 kids   6,9,17      Fulltime: Haematologist for [employer]   Social Drivers of Health   Tobacco  Use: High Risk (04/13/2024)   Patient History    Smoking Tobacco Use: Every Day    Smokeless Tobacco Use: Never    Passive Exposure: Not on file  Financial Resource Strain: Not on file  Food Insecurity: Not on file  Transportation Needs: Not on file  Physical Activity: Not on file  Stress: Not on file  Social Connections: Not on file  Depression (PHQ2-9): Low Risk (03/20/2024)   Depression (PHQ2-9)    PHQ-2 Score: 3  Alcohol Screen: Not on file  Housing: Not on file  Utilities: Not on file  Health Literacy: Not on file     Family History:  The patient's family history includes Heart attack in his father; Heart disease in his father; Hyperlipidemia in his mother; Hypertension in his mother. There is no history of Sudden death.  ROS:   12-point review of systems is negative unless otherwise noted in the HPI.   EKGs/Labs/Other Studies Reviewed:    Studies reviewed were summarized above. The additional studies were reviewed today:  Carotid artery ultrasound 03/27/2024: Summary:  Right Carotid: There is no evidence of stenosis in the right ICA.   Left Carotid: There is no evidence of stenosis in the left ICA.   Vertebrals:  Bilateral vertebral arteries demonstrate antegrade flow.  Subclavians: Normal flow hemodynamics were seen in bilateral subclavian arteries.  __________  Coronary CTA 03/26/2024: FINDINGS: Aorta:  Normal size.  No calcifications.  No dissection.   Aortic Valve:  Trileaflet.  No calcifications.   Coronary Arteries:  Normal coronary origin.  Right dominance.   RCA is a dominant artery. There is no plaque.   Left main gives rise to LAD and LCX arteries. LM has no disease.   LAD has no plaque.   LCX is a non-dominant artery.  There is no plaque.   Other findings:   Normal pulmonary vein drainage into the left atrium.   Normal left atrial appendage without a thrombus.   Normal size of the pulmonary artery.   IMPRESSION: 1. Coronary calcium  score of 0. 2. Normal coronary origin with right dominance. 3. No evidence of CAD. 4. CAD-RADS 0. Consider non-atherosclerotic causes of chest pain __________  2D echo 02/12/2024: 1. Left ventricular ejection fraction, by estimation, is 55 to 60%. The  left ventricle has normal function. The left ventricle has no regional  wall motion abnormalities. Left ventricular diastolic parameters are  consistent with Grade I diastolic  dysfunction (impaired relaxation).   2. Right ventricular systolic function is normal. The right ventricular  size is normal.   3. The mitral valve is normal in structure. No evidence of mitral valve  regurgitation. No evidence of mitral stenosis.   4. The aortic valve is normal in structure. Aortic valve regurgitation is  not visualized. No aortic stenosis is present.  5. The inferior vena cava is normal in size with greater than 50%  respiratory variability, suggesting right atrial pressure of 3 mmHg.  __________   Zio patch 12/2023: Patch Wear Time:  13 days and 3 hours (2025-10-12T21:22:10-398 to 2025-10-06T21:54:47-0400)   Monitor 1 HR 49 - 179, average 92 bpm. 1 nonsustained SVT (longest 5 beats). No atrial fibrillation detected. Rare supraventricular ectopy. Rare ventricular ectopy. No sustained arrhythmias. No symptom trigger episodes.   Monitor 2 HR 48 - 240, average 93 bpm. 1 nonsustained VT (longest 5 beats). No atrial fibrillation detected. Rare supraventricular ectopy. Rare ventricular ectopy. No sustained arrhythmias. There was 1 symptom trigger episode which corresponded to sinus rhythm.   EKG:  EKG is ordered today.  The EKG ordered today demonstrates ***  Recent Labs: 01/14/2024: Pro B Natriuretic peptide (BNP) 5.0 03/20/2024: ALT 21; BUN 19; Creat 1.35; Hemoglobin 16.6; Platelets 263; Potassium 4.6; Sodium 141; TSH 0.76  Recent Lipid Panel    Component Value Date/Time   CHOL 143 03/20/2024 1436   TRIG 71 03/20/2024 1436    HDL 73 03/20/2024 1436   CHOLHDL 2.0 03/20/2024 1436   VLDL 7.0 04/12/2022 1505   LDLCALC 55 03/20/2024 1436    PHYSICAL EXAM:    VS:  There were no vitals taken for this visit.  BMI: There is no height or weight on file to calculate BMI.  Physical Exam  Wt Readings from Last 3 Encounters:  04/24/24 187 lb (84.8 kg)  03/20/24 192 lb (87.1 kg)  02/27/24 191 lb (86.6 kg)     ASSESSMENT & PLAN:   Precordial pain:  Syncope/near syncope:  Alcohol/tobacco use:   {Are you ordering a CV Procedure (e.g. stress test, cath, DCCV, TEE, etc)?   Press F2        :[id number]}     Disposition: F/u with Dr. PIERRETTE or an APP in ***.   Medication Adjustments/Labs and Tests Ordered: Current medicines are reviewed at length with the patient today.  Concerns regarding medicines are outlined above. Medication changes, Labs and Tests ordered today are summarized above and listed in the Patient Instructions accessible in Encounters.   SignedBernardino Bring, PA-C 04/25/2024 8:57 PM     [facility] [place] [address] [place], [place] [address] [phone]     [1]  No outpatient medications have been marked as taking for the 04/28/24 encounter (Appointment) with Bring Bernardino HERO, PA-C.   "

## 2024-04-28 ENCOUNTER — Ambulatory Visit: Attending: Physician Assistant | Admitting: Physician Assistant

## 2024-04-28 DIAGNOSIS — F1090 Alcohol use, unspecified, uncomplicated: Secondary | ICD-10-CM

## 2024-04-28 DIAGNOSIS — Z720 Tobacco use: Secondary | ICD-10-CM

## 2024-04-28 DIAGNOSIS — R55 Syncope and collapse: Secondary | ICD-10-CM

## 2024-05-20 ENCOUNTER — Ambulatory Visit

## 2025-03-22 ENCOUNTER — Encounter: Admitting: Nurse Practitioner
